# Patient Record
Sex: Female | Born: 1990 | Race: Black or African American | Hispanic: No | Marital: Single | Smoking: Current every day smoker
Health system: Southern US, Community
[De-identification: ages and names within clinical notes are randomized; demographics above are authoritative.]

## PROBLEM LIST (undated history)

## (undated) DIAGNOSIS — F411 Generalized anxiety disorder: Secondary | ICD-10-CM

## (undated) DIAGNOSIS — R51 Headache: Secondary | ICD-10-CM

## (undated) DIAGNOSIS — F319 Bipolar disorder, unspecified: Secondary | ICD-10-CM

## (undated) DIAGNOSIS — A5901 Trichomonal vulvovaginitis: Secondary | ICD-10-CM

## (undated) DIAGNOSIS — R519 Headache, unspecified: Secondary | ICD-10-CM

## (undated) DIAGNOSIS — N750 Cyst of Bartholin's gland: Secondary | ICD-10-CM

## (undated) DIAGNOSIS — Z8619 Personal history of other infectious and parasitic diseases: Secondary | ICD-10-CM

## (undated) DIAGNOSIS — D649 Anemia, unspecified: Secondary | ICD-10-CM

## (undated) DIAGNOSIS — K219 Gastro-esophageal reflux disease without esophagitis: Secondary | ICD-10-CM

## (undated) HISTORY — DX: Trichomonal vulvovaginitis: A59.01

## (undated) HISTORY — PX: WISDOM TOOTH EXTRACTION: SHX21

---
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
## 2013-03-01 ENCOUNTER — Emergency Department (HOSPITAL_COMMUNITY): Payer: Managed Care, Other (non HMO)

## 2013-03-01 ENCOUNTER — Encounter (HOSPITAL_COMMUNITY): Payer: Self-pay | Admitting: Emergency Medicine

## 2013-03-01 ENCOUNTER — Emergency Department (HOSPITAL_COMMUNITY)
Admission: EM | Admit: 2013-03-01 | Discharge: 2013-03-01 | Disposition: A | Discharge status: Home / Self Care | Payer: Managed Care, Other (non HMO) | Attending: Emergency Medicine | Admitting: Emergency Medicine

## 2013-03-01 DIAGNOSIS — S7002XA Contusion of left hip, initial encounter: Secondary | ICD-10-CM

## 2013-03-01 DIAGNOSIS — Z3202 Encounter for pregnancy test, result negative: Secondary | ICD-10-CM | POA: Insufficient documentation

## 2013-03-01 DIAGNOSIS — IMO0002 Reserved for concepts with insufficient information to code with codable children: Secondary | ICD-10-CM | POA: Insufficient documentation

## 2013-03-01 DIAGNOSIS — Z79899 Other long term (current) drug therapy: Secondary | ICD-10-CM | POA: Insufficient documentation

## 2013-03-01 DIAGNOSIS — S7000XA Contusion of unspecified hip, initial encounter: Secondary | ICD-10-CM | POA: Insufficient documentation

## 2013-03-01 DIAGNOSIS — Y9301 Activity, walking, marching and hiking: Secondary | ICD-10-CM | POA: Insufficient documentation

## 2013-03-01 DIAGNOSIS — F17200 Nicotine dependence, unspecified, uncomplicated: Secondary | ICD-10-CM | POA: Insufficient documentation

## 2013-03-01 DIAGNOSIS — F319 Bipolar disorder, unspecified: Secondary | ICD-10-CM | POA: Insufficient documentation

## 2013-03-01 DIAGNOSIS — Y92410 Unspecified street and highway as the place of occurrence of the external cause: Secondary | ICD-10-CM | POA: Insufficient documentation

## 2013-03-01 HISTORY — DX: Bipolar disorder, unspecified: F31.9

## 2013-03-01 MED ORDER — MORPHINE SULFATE 4 MG/ML IJ SOLN
4.0000 mg | Freq: Once | INTRAMUSCULAR | Status: AC
  Administered 2013-03-01: 4 mg via INTRAMUSCULAR
  Filled 2013-03-01: qty 1

## 2013-03-01 MED ORDER — IBUPROFEN 800 MG PO TABS: 800.0000 mg | ORAL_TABLET | Freq: Three times a day (TID) | ORAL | Status: DC | PRN
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
## 2013-03-01 NOTE — ED Notes (Signed)
Patient transported to X-ray 
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
## 2013-07-12 ENCOUNTER — Other Ambulatory Visit: Payer: Self-pay | Admitting: Obstetrics & Gynecology

## 2013-07-17 ENCOUNTER — Encounter (HOSPITAL_COMMUNITY): Payer: Self-pay | Admitting: Pharmacist

## 2013-07-17 ENCOUNTER — Encounter (HOSPITAL_COMMUNITY): Payer: Managed Care, Other (non HMO) | Admitting: Anesthesiology

## 2013-07-17 ENCOUNTER — Encounter (HOSPITAL_COMMUNITY)
Admission: RE | Disposition: A | Discharge status: Home / Self Care | Payer: Self-pay | Source: Ambulatory Visit | Attending: Obstetrics & Gynecology

## 2013-07-17 ENCOUNTER — Ambulatory Visit (HOSPITAL_COMMUNITY)
Admission: RE | Admit: 2013-07-17 | Discharge: 2013-07-17 | Disposition: A | Discharge status: Home / Self Care | Payer: Managed Care, Other (non HMO) | Source: Ambulatory Visit | Attending: Obstetrics & Gynecology | Admitting: Obstetrics & Gynecology

## 2013-07-17 ENCOUNTER — Ambulatory Visit (HOSPITAL_COMMUNITY): Payer: Managed Care, Other (non HMO) | Admitting: Anesthesiology

## 2013-07-17 DIAGNOSIS — N751 Abscess of Bartholin's gland: Secondary | ICD-10-CM | POA: Insufficient documentation

## 2013-07-17 DIAGNOSIS — F17200 Nicotine dependence, unspecified, uncomplicated: Secondary | ICD-10-CM | POA: Insufficient documentation

## 2013-07-17 DIAGNOSIS — F319 Bipolar disorder, unspecified: Secondary | ICD-10-CM | POA: Insufficient documentation

## 2013-07-17 HISTORY — PX: BARTHOLIN CYST MARSUPIALIZATION: SHX5383

## 2013-07-17 HISTORY — PX: DILATION AND CURETTAGE OF UTERUS: SHX78

## 2013-07-17 SURGERY — MARSUPIALIZATION, CYST, BARTHOLIN'S GLAND
Anesthesia: General | Site: Vagina

## 2013-07-17 MED ORDER — MIDAZOLAM HCL 2 MG/2ML IJ SOLN
INTRAMUSCULAR | Status: AC
  Filled 2013-07-17: qty 2

## 2013-07-17 MED ORDER — KETOROLAC TROMETHAMINE 30 MG/ML IJ SOLN
INTRAMUSCULAR | Status: DC | PRN
  Administered 2013-07-17: 30 mg via INTRAVENOUS

## 2013-07-17 MED ORDER — LIDOCAINE HCL (CARDIAC) 20 MG/ML IV SOLN
INTRAVENOUS | Status: AC
  Filled 2013-07-17: qty 5

## 2013-07-17 MED ORDER — PROPOFOL 10 MG/ML IV EMUL
INTRAVENOUS | Status: AC
  Filled 2013-07-17: qty 20

## 2013-07-17 MED ORDER — ONDANSETRON HCL 4 MG/2ML IJ SOLN
INTRAMUSCULAR | Status: DC | PRN
  Administered 2013-07-17: 4 mg via INTRAVENOUS

## 2013-07-17 MED ORDER — BUPIVACAINE HCL (PF) 0.5 % IJ SOLN
INTRAMUSCULAR | Status: AC
  Filled 2013-07-17: qty 30

## 2013-07-17 MED ORDER — DEXAMETHASONE SODIUM PHOSPHATE 10 MG/ML IJ SOLN
INTRAMUSCULAR | Status: AC
  Filled 2013-07-17: qty 1

## 2013-07-17 MED ORDER — BACITRACIN ZINC 500 UNIT/GM EX OINT
TOPICAL_OINTMENT | CUTANEOUS | Status: AC
  Filled 2013-07-17: qty 15

## 2013-07-17 MED ORDER — FENTANYL CITRATE 0.05 MG/ML IJ SOLN
INTRAMUSCULAR | Status: AC
  Filled 2013-07-17: qty 5

## 2013-07-17 MED ORDER — ONDANSETRON HCL 4 MG/2ML IJ SOLN
INTRAMUSCULAR | Status: AC
  Filled 2013-07-17: qty 2

## 2013-07-17 MED ORDER — MIDAZOLAM HCL 2 MG/2ML IJ SOLN
INTRAMUSCULAR | Status: DC | PRN
  Administered 2013-07-17: 2 mg via INTRAVENOUS

## 2013-07-17 MED ORDER — KETOROLAC TROMETHAMINE 30 MG/ML IJ SOLN
INTRAMUSCULAR | Status: AC
  Filled 2013-07-17: qty 1

## 2013-07-17 MED ORDER — FENTANYL CITRATE 0.05 MG/ML IJ SOLN: 25.0000 ug | INTRAMUSCULAR | Status: DC | PRN

## 2013-07-17 MED ORDER — FENTANYL CITRATE 0.05 MG/ML IJ SOLN
INTRAMUSCULAR | Status: DC | PRN
  Administered 2013-07-17: 100 ug via INTRAVENOUS

## 2013-07-17 MED ORDER — BACITRACIN ZINC 500 UNIT/GM EX OINT
TOPICAL_OINTMENT | CUTANEOUS | Status: DC | PRN
  Administered 2013-07-17: 1 via TOPICAL

## 2013-07-17 MED ORDER — BUPIVACAINE HCL 0.5 % IJ SOLN
INTRAMUSCULAR | Status: DC | PRN
  Administered 2013-07-17: 18 mL

## 2013-07-17 MED ORDER — LIDOCAINE HCL (CARDIAC) 20 MG/ML IV SOLN
INTRAVENOUS | Status: DC | PRN
  Administered 2013-07-17: 50 mg via INTRAVENOUS

## 2013-07-17 MED ORDER — LACTATED RINGERS IV SOLN
INTRAVENOUS | Status: DC
Start: 2013-07-17 — End: 2013-07-17
  Administered 2013-07-17 (×2): via INTRAVENOUS

## 2013-07-17 MED ORDER — BUPIVACAINE-EPINEPHRINE (PF) 0.5% -1:200000 IJ SOLN
INTRAMUSCULAR | Status: AC
  Filled 2013-07-17: qty 30

## 2013-07-17 MED ORDER — DEXAMETHASONE SODIUM PHOSPHATE 10 MG/ML IJ SOLN
INTRAMUSCULAR | Status: DC | PRN
  Administered 2013-07-17: 10 mg via INTRAVENOUS

## 2013-07-17 MED ORDER — PROPOFOL 10 MG/ML IV BOLUS
INTRAVENOUS | Status: DC | PRN
  Administered 2013-07-17: 20 mg via INTRAVENOUS
  Administered 2013-07-17: 180 mg via INTRAVENOUS

## 2013-07-17 SURGICAL SUPPLY — 25 items
CATH ROBINSON RED A/P 16FR (CATHETERS) ×4 IMPLANT
CLOTH BEACON ORANGE TIMEOUT ST (SAFETY) ×4 IMPLANT
CONTAINER PREFILL 10% NBF 60ML (FORM) IMPLANT
DRSG TELFA 3X8 NADH (GAUZE/BANDAGES/DRESSINGS) ×4 IMPLANT
ELECT NEEDLE TIP 2.8 STRL (NEEDLE) ×4 IMPLANT
GAUZE SPONGE 4X4 16PLY XRAY LF (GAUZE/BANDAGES/DRESSINGS) ×8 IMPLANT
GLOVE BIO SURGEON STRL SZ 6.5 (GLOVE) ×3 IMPLANT
GLOVE BIO SURGEONS STRL SZ 6.5 (GLOVE) ×1
GLOVE BIOGEL PI IND STRL 7.0 (GLOVE) ×2 IMPLANT
GLOVE BIOGEL PI INDICATOR 7.0 (GLOVE) ×2
GLOVE ECLIPSE 6.0 STRL STRAW (GLOVE) ×4 IMPLANT
GOWN STRL REUS W/TWL LRG LVL3 (GOWN DISPOSABLE) ×16 IMPLANT
NEEDLE SPNL 22GX3.5 QUINCKE BK (NEEDLE) ×4 IMPLANT
PACK VAGINAL MINOR WOMEN LF (CUSTOM PROCEDURE TRAY) ×4 IMPLANT
PAD OB MATERNITY 4.3X12.25 (PERSONAL CARE ITEMS) ×4 IMPLANT
SPONGE SURGIFOAM ABS GEL 12-7 (HEMOSTASIS) ×4 IMPLANT
SUT MON AB 3-0 SH 27 (SUTURE) ×4
SUT MON AB 3-0 SH27 (SUTURE) ×4 IMPLANT
SUT VIC AB 3-0 SH 27 (SUTURE) ×2
SUT VIC AB 3-0 SH 27XBRD (SUTURE) ×2 IMPLANT
SWAB CULTURE LIQ STUART DBL (MISCELLANEOUS) ×4 IMPLANT
SYR CONTROL 10ML LL (SYRINGE) ×4 IMPLANT
TOWEL OR 17X24 6PK STRL BLUE (TOWEL DISPOSABLE) ×8 IMPLANT
WATER STERILE IRR 1000ML POUR (IV SOLUTION) ×4 IMPLANT
YANKAUER SUCT BULB TIP NO VENT (SUCTIONS) ×4 IMPLANT

## 2013-07-17 NOTE — Discharge Instructions (Signed)

## 2013-07-17 NOTE — Anesthesia Postprocedure Evaluation (Signed)
  Anesthesia Post-op Note  Patient: Suzanne Holt  Procedure(s) Performed: Procedure(s): BARTHOLIN CYST MARSUPIALIZATION (N/A) Bartholin Cyst culture  Patient Location: PACU  Anesthesia Type:General  Level of Consciousness: awake, alert  and oriented  Airway and Oxygen Therapy: Patient Spontanous Breathing  Post-op Pain: none  Post-op Assessment: Post-op Vital signs reviewed, Patient's Cardiovascular Status Stable, Respiratory Function Stable, Patent Airway, No signs of Nausea or vomiting and Pain level controlled  Post-op Vital Signs: Reviewed and stable  Last Vitals:  Filed Vitals:   07/17/13 1345  BP: 114/65  Pulse: 69  Temp:   Resp: 15    Complications: No apparent anesthesia complications

## 2013-07-17 NOTE — H&P (Signed)
  Suzanne Holt is an 23 y.o. female. Presents for Bartholin Cyst Marsupialization.  I&D attempted in office, unsuccessful. 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  Pertinent Gynecological History:  Menses: Normal  Bleeding: normal  Contraception: condoms  DES exposure: denies  Blood transfusions: none  Sexually transmitted diseases: no past history  Previous GYN Procedures: none  Last mammogram: n/a   Last pap: n/a  OB History: G0,  Menstrual History:  Menarche age: [age]  Patient's last menstrual period was 06/23/2013.   Past Medical History   Diagnosis  Date   .  Bipolar 1 disorder     No past surgical history on file.  No family history on file.  Social History: reports that she has been smoking. She does not have any smokeless tobacco history on file. She reports that she drinks alcohol. She reports that she does not use illicit drugs.  Allergies: No Known Allergies  Prescriptions prior to admission   Medication  Sig  Dispense  Refill   .  QUEtiapine (SEROQUEL) 400 MG tablet  Take 200 mg by mouth at bedtime.     .  traMADol (ULTRAM) 50 MG tablet  Take 50 mg by mouth every 6 (six) hours as needed for moderate pain.     Marland Kitchen.  lidocaine (XYLOCAINE) 2 % jelly  Apply 1 application topically 2 (two) times daily as needed.     .  metroNIDAZOLE (FLAGYL) 500 MG tablet  Take 500 mg by mouth 2 (two) times daily.     Marland Kitchen.  oxyCODONE-acetaminophen (PERCOCET/ROXICET) 5-325 MG per tablet  Take 1 tablet by mouth every 4 (four) hours as needed for severe pain.      Review of Systems  Constitutional: Negative for fever.  Eyes: Negative for blurred vision.  Cardiovascular: Negative for chest pain.  Gastrointestinal: Negative for heartburn.  Genitourinary: Negative for dysuria.  Skin: Negative for rash.  Neurological: Negative for dizziness.  Endo/Heme/Allergies: Does not bruise/bleed easily.  Blood pressure 121/67, pulse 91, temperature 98.6 F (37 C), temperature  source Oral, resp. rate 20, height 5\' 4"  (1.626 m), weight 65.772 kg (145 lb), last menstrual period 06/23/2013, SpO2 99.00%.  Physical Exam  Constitutional: She is oriented to person, place, and time. She appears well-developed and well-nourished.  Cardiovascular: Normal rate and regular rhythm.  Genitourinary:  Right Bartholin Cyst  Neurological: She is alert and oriented to person, place, and time.  No results found for this or any previous visit (from the past 24 hour(s)).  No results found.   Assessment/Plan:  23 yo healthy female with recurrent Right Bartholin Cyst  To OR for Bartholin Cyst Marsupialization  Suzanne Holt  07/17/2013, 12:00 PM

## 2013-07-17 NOTE — Transfer of Care (Signed)
Immediate Anesthesia Transfer of Care Note  Patient: Suzanne Holt  Procedure(s) Performed: Procedure(s): BARTHOLIN CYST MARSUPIALIZATION (N/A) Bartholin Cyst culture  Patient Location: PACU  Anesthesia Type:General  Level of Consciousness: awake  Airway & Oxygen Therapy: Patient Spontanous Breathing  Post-op Assessment: Report given to PACU RN  Post vital signs: stable  Filed Vitals:   07/17/13 1125  BP: 121/67  Pulse: 91  Temp: 37 C  Resp: 20    Complications: No apparent anesthesia complications

## 2013-07-17 NOTE — Anesthesia Preprocedure Evaluation (Signed)
Anesthesia Evaluation  Patient identified by MRN, date of birth, ID band Patient awake    Reviewed: Allergy & Precautions, H&P , Patient's Chart, lab work & pertinent test results, reviewed documented beta blocker date and time   Airway Mallampati: II  TM Distance: >3 FB Neck ROM: full    Dental no notable dental hx.    Pulmonary Current Smoker,  breath sounds clear to auscultation  Pulmonary exam normal       Cardiovascular Rhythm:regular Rate:Normal     Neuro/Psych    GI/Hepatic   Endo/Other    Renal/GU      Musculoskeletal   Abdominal   Peds  Hematology   Anesthesia Other Findings   Reproductive/Obstetrics                            Anesthesia Physical Anesthesia Plan  ASA: II  Anesthesia Plan:    Post-op Pain Management:    Induction: Intravenous  Airway Management Planned: LMA  Additional Equipment:   Intra-op Plan:   Post-operative Plan:   Informed Consent: I have reviewed the patients History and Physical, chart, labs and discussed the procedure including the risks, benefits and alternatives for the proposed anesthesia with the patient or authorized representative who has indicated his/her understanding and acceptance.   Dental Advisory Given and Dental advisory given  Plan Discussed with: CRNA and Surgeon  Anesthesia Plan Comments: (Discussed GA with LMA, possible sore throat, potential need to switch to ETT, N/V, pulmonary aspiration. Questions answered. )        Anesthesia Quick Evaluation  

## 2013-07-18 ENCOUNTER — Encounter (HOSPITAL_COMMUNITY): Payer: Self-pay | Admitting: Obstetrics & Gynecology

## 2013-07-18 ENCOUNTER — Telehealth (HOSPITAL_BASED_OUTPATIENT_CLINIC_OR_DEPARTMENT_OTHER): Payer: Self-pay

## 2013-07-18 NOTE — Op Note (Signed)
Pre-operative Diagnosis: Right Bartholin Cyst  Post-operative Diagnosis: Right Bartholin Abscess  Surgeon: Surgeon(s) and Role:    * Essie HartWalda Namish Krise, MD - Primary    * Mickel Baasichard D Kaplan, MD - Assisting   Procedure and Anesthesia:  Procedure(s) and Anesthesia Type:    * BARTHOLIN CYST MARSUPIALIZATION - General    * Bartholin Cyst culture  ASA Class: 3   ANESTHESIA: IV sedation/LMA and local 0.25% Marcaine   INTRAVENOUS FLUIDS: 1000mL   ESTIMATED BLOOD LOSS: 15mL   URINE OUTPUT: 50mL  FINDINGS: Right 3cm Bartholin Cyst, copious amount of purulent material evacuated. Otherwise normal external genitalia  SPECIMEN: Culture from Bartholin Abscess fluid  COMPLICATIONS:  none   INDICATIONS FOR THE PROCEDURE Ms. Noe Genseters is a 23 year  old  G0  with  a  history  of  Right bartholin's gland cyst previously treated with placement of a Word catheter with subsequent recurrence of the cyst x 3  SURGICAL RISKS: The patient was informed of the risks and benefits of marsupialization of a Bartholin cyst. Risks included but were not limited to bleeding, infection, injury to the vulva, vagina, or surrounding tissues and/or recurrence of the cyst. The patient expressed understanding of the risks involved, all questions were answered, and the patient consented to the procedure.  DESCRIPTION OF THE PROCEDURE The patient was taken to the operating room where a time out was performed to confirm correct patient and correct procedure. IV sedation was established and found to be adequate. The patient was then positioned on the operating table in the dorsal lithotomy position with the legs supported using stirrups. All pressure points were padded and a Bair hugger/warm blankets was/were placed to maintain control of core body temperature. The patient was then prepped and draped in the usual sterile fashion. The labia were palpated bilaterally Right 3cm Bartholin Cyst palpated. A 3 cm vertical incision was made at the  junction between the skin and the mucosa and the cyst was partially dissected off the mucosa using a 15 blade scalpel.  On opening, the cyst was noted to contain mucinous,purulent malodorous material. The cyst wall was then sutured to the surrounding mucosa using 2-0 synthetic absorbable suture (Vicryl) Patient tolerated procedure well and was transferred to the PACU in good condition.  All instrument, needle, raytec counts were correct x 3.    Essie HartWalda Makennah Omura

## 2013-07-18 NOTE — Telephone Encounter (Signed)
[facility].  [facility] Dr Essie HartWalda Pinn [facility] .

## 2013-07-19 LAB — BODY FLUID CULTURE

## 2014-03-25 ENCOUNTER — Encounter (HOSPITAL_COMMUNITY): Payer: Self-pay | Admitting: Emergency Medicine

## 2014-03-25 ENCOUNTER — Emergency Department (HOSPITAL_COMMUNITY): Payer: Worker's Compensation

## 2014-03-25 ENCOUNTER — Emergency Department (HOSPITAL_COMMUNITY)
Admission: EM | Admit: 2014-03-25 | Discharge: 2014-03-25 | Disposition: A | Discharge status: Home / Self Care | Payer: Worker's Compensation | Attending: Emergency Medicine | Admitting: Emergency Medicine

## 2014-03-25 DIAGNOSIS — Y9389 Activity, other specified: Secondary | ICD-10-CM | POA: Diagnosis not present

## 2014-03-25 DIAGNOSIS — Z792 Long term (current) use of antibiotics: Secondary | ICD-10-CM | POA: Diagnosis not present

## 2014-03-25 DIAGNOSIS — W260XXA Contact with knife, initial encounter: Secondary | ICD-10-CM | POA: Diagnosis not present

## 2014-03-25 DIAGNOSIS — Y92009 Unspecified place in unspecified non-institutional (private) residence as the place of occurrence of the external cause: Secondary | ICD-10-CM | POA: Diagnosis not present

## 2014-03-25 DIAGNOSIS — R531 Weakness: Secondary | ICD-10-CM | POA: Insufficient documentation

## 2014-03-25 DIAGNOSIS — Z23 Encounter for immunization: Secondary | ICD-10-CM | POA: Insufficient documentation

## 2014-03-25 DIAGNOSIS — Y990 Civilian activity done for income or pay: Secondary | ICD-10-CM | POA: Diagnosis not present

## 2014-03-25 DIAGNOSIS — Z720 Tobacco use: Secondary | ICD-10-CM | POA: Diagnosis not present

## 2014-03-25 DIAGNOSIS — Z8659 Personal history of other mental and behavioral disorders: Secondary | ICD-10-CM | POA: Diagnosis not present

## 2014-03-25 DIAGNOSIS — S61217A Laceration without foreign body of left little finger without damage to nail, initial encounter: Secondary | ICD-10-CM | POA: Insufficient documentation

## 2014-03-25 DIAGNOSIS — S61219A Laceration without foreign body of unspecified finger without damage to nail, initial encounter: Secondary | ICD-10-CM

## 2014-03-25 MED ORDER — LIDOCAINE HCL 2 % IJ SOLN
5.0000 mL | Freq: Once | INTRAMUSCULAR | Status: AC
  Administered 2014-03-25: 100 mg via INTRADERMAL
  Filled 2014-03-25: qty 20

## 2014-03-25 MED ORDER — IBUPROFEN 600 MG PO TABS: 600.0000 mg | ORAL_TABLET | Freq: Four times a day (QID) | ORAL | Status: DC | PRN

## 2014-03-25 MED ORDER — CEPHALEXIN 250 MG PO CAPS: 250.0000 mg | ORAL_CAPSULE | Freq: Four times a day (QID) | ORAL | Status: DC

## 2014-03-25 MED ORDER — TETANUS-DIPHTH-ACELL PERTUSSIS 5-2.5-18.5 LF-MCG/0.5 IM SUSP
0.5000 mL | Freq: Once | INTRAMUSCULAR | Status: AC
  Administered 2014-03-25: 0.5 mL via INTRAMUSCULAR
  Filled 2014-03-25: qty 0.5

## 2014-03-25 NOTE — ED Notes (Signed)
Pt presents with laceration to L little finger. Pt states she was at work at [employer] tonight, attempted to cut brownies with steak knife and cut finger. Clean dry bandage in place

## 2014-03-25 NOTE — ED Notes (Signed)
Suture cart at bedside 

## 2014-03-25 NOTE — Discharge Instructions (Signed)
Ibuprofen for pain. Keflex to prevent infection. Follow up with Dr. Amanda PeaGramig in the office as referred. Your apt is on Thursday at 7:30 am.   Laceration Care, Adult A laceration is a cut or lesion that goes through all layers of the skin and into the tissue just beneath the skin. TREATMENT  Some lacerations may not require closure. Some lacerations may not be able to be closed due to an increased risk of infection. It is important to see your caregiver as soon as possible after an injury to minimize the risk of infection and maximize the opportunity for successful closure. If closure is appropriate, pain medicines may be given, if needed. The wound will be cleaned to help prevent infection. Your caregiver will use stitches (sutures), staples, wound glue (adhesive), or skin adhesive strips to repair the laceration. These tools bring the skin edges together to allow for faster healing and a better cosmetic outcome. However, all wounds will heal with a scar. Once the wound has healed, scarring can be minimized by covering the wound with sunscreen during the day for 1 full year. HOME CARE INSTRUCTIONS  For sutures or staples:  Keep the wound clean and dry.  If you were given a bandage (dressing), you should change it at least once a day. Also, change the dressing if it becomes wet or dirty, or as directed by your caregiver.  Wash the wound with soap and water 2 times a day. Rinse the wound off with water to remove all soap. Pat the wound dry with a clean towel.  After cleaning, apply a thin layer of the antibiotic ointment as recommended by your caregiver. This will help prevent infection and keep the dressing from sticking.  You may shower as usual after the first 24 hours. Do not soak the wound in water until the sutures are removed.  Only take over-the-counter or prescription medicines for pain, discomfort, or fever as directed by your caregiver.  Get your sutures or staples removed as directed by  your caregiver. For skin adhesive strips:  Keep the wound clean and dry.  Do not get the skin adhesive strips wet. You may bathe carefully, using caution to keep the wound dry.  If the wound gets wet, pat it dry with a clean towel.  Skin adhesive strips will fall off on their own. You may trim the strips as the wound heals. Do not remove skin adhesive strips that are still stuck to the wound. They will fall off in time. For wound adhesive:  You may briefly wet your wound in the shower or bath. Do not soak or scrub the wound. Do not swim. Avoid periods of heavy perspiration until the skin adhesive has fallen off on its own. After showering or bathing, gently pat the wound dry with a clean towel.  Do not apply liquid medicine, cream medicine, or ointment medicine to your wound while the skin adhesive is in place. This may loosen the film before your wound is healed.  If a dressing is placed over the wound, be careful not to apply tape directly over the skin adhesive. This may cause the adhesive to be pulled off before the wound is healed.  Avoid prolonged exposure to sunlight or tanning lamps while the skin adhesive is in place. Exposure to ultraviolet light in the first year will darken the scar.  The skin adhesive will usually remain in place for 5 to 10 days, then naturally fall off the skin. Do not pick at the  adhesive film. You may need a tetanus shot if:  You cannot remember when you had your last tetanus shot.  You have never had a tetanus shot. If you get a tetanus shot, your arm may swell, get red, and feel warm to the touch. This is common and not a problem. If you need a tetanus shot and you choose not to have one, there is a rare chance of getting tetanus. Sickness from tetanus can be serious. SEEK MEDICAL CARE IF:   You have redness, swelling, or increasing pain in the wound.  You see a red line that goes away from the wound.  You have yellowish-white fluid (pus) coming  from the wound.  You have a fever.  You notice a bad smell coming from the wound or dressing.  Your wound breaks open before or after sutures have been removed.  You notice something coming out of the wound such as wood or glass.  Your wound is on your hand or foot and you cannot move a finger or toe. SEEK IMMEDIATE MEDICAL CARE IF:   Your pain is not controlled with prescribed medicine.  You have severe swelling around the wound causing pain and numbness or a change in color in your arm, hand, leg, or foot.  Your wound splits open and starts bleeding.  You have worsening numbness, weakness, or loss of function of any joint around or beyond the wound.  You develop painful lumps near the wound or on the skin anywhere on your body. MAKE SURE YOU:   Understand these instructions.  Will watch your condition.  Will get help right away if you are not doing well or get worse. Document Released: 02/28/2005 Document Revised: 05/23/2011 Document Reviewed: 08/24/2010 [facility] Patient Information 2015 [place], [place]. This information is not intended to replace advice given to you by your health care provider. Make sure you discuss any questions you have with your health care provider.

## 2014-03-25 NOTE — ED Provider Notes (Signed)
CSN: [id number]     Arrival date & time 03/25/14  1942 History   First MD Initiated Contact with Patient 03/25/14 2201     Chief Complaint  Patient presents with  . Laceration     (Consider location/radiation/quality/duration/timing/severity/associated sxs/prior Treatment) HPI Suzanne Holt is a 24 y.o. female with no medical problems, presents to ED with complaint of laceration to the left little finger. States was using steak knife when accidentally cutting herself. States unable to bend finger. No other injuries. No numbness distally. Tetanus unknown. Pt applied pressure to stop bleeding. Movement of finger and palpation making symptoms worse, nothing making them beter  Past Medical History  Diagnosis Date  . Bipolar 1 disorder    Past Surgical History  Procedure Laterality Date  . Bartholin cyst marsupialization N/A 07/17/2013    Procedure: BARTHOLIN CYST MARSUPIALIZATION;  Surgeon: Essie Hart, MD;  Location: [facility];  Service: Gynecology;  Laterality: N/A;  . Dilation and curettage of uterus  07/17/2013    Procedure: Bartholin Cyst culture;  Surgeon: Essie Hart, MD;  Location: [facility];  Service: Gynecology;;   No family history on file. History  Substance Use Topics  . Smoking status: Current Some Day Smoker  . Smokeless tobacco: Not on file  . Alcohol Use: Yes   OB History    No data available     Review of Systems  Constitutional: Negative for fever and chills.  Skin: Positive for wound.  Neurological: Positive for weakness. Negative for numbness.      Allergies  Review of patient's allergies indicates no known allergies.  Home Medications   Prior to Admission medications   Medication Sig Start Date End Date Taking? Authorizing Provider  ibuprofen (ADVIL,MOTRIN) 200 MG tablet Take 400 mg by mouth every 6 (six) hours as needed for headache or moderate pain.   Yes Historical Provider, MD  metroNIDAZOLE (FLAGYL) 500 MG tablet Take 500 mg by mouth 2 (two) times  daily.    Historical Provider, MD  oxyCODONE-acetaminophen (PERCOCET/ROXICET) 5-325 MG per tablet Take 1 tablet by mouth every 4 (four) hours as needed for severe pain.    Historical Provider, MD  traMADol (ULTRAM) 50 MG tablet Take 50 mg by mouth every 6 (six) hours as needed for moderate pain.    Historical Provider, MD   BP 125/72 mmHg  Pulse 106  Temp(Src) 97.9 F (36.6 C) (Oral)  Resp 16  SpO2 100%  LMP 02/22/2014 Physical Exam  Constitutional: She appears well-developed and well-nourished. No distress.  Eyes: Conjunctivae are normal.  Neck: Neck supple.  Musculoskeletal:  Pt with laceration to the pip joint over palmar surface of left 5th finger. Unable to flex at PIP or DIP joints. Able to flex at MCP joint. Normal extension at all joints. Pt holding finger extended at rest. Sensation intact distally. Cap refill <2sec distally  Neurological: She is alert.  Skin: Skin is warm and dry.  1cm laceration to the palmar surface of left fifth finger at pip joint.   Nursing note and vitals reviewed.   ED Course  Procedures (including critical care time) Labs Review Labs Reviewed - No data to display  Imaging Review Dg Finger Little Left  03/25/2014   CLINICAL DATA:  Little finger laceration  EXAM: LEFT LITTLE FINGER 2+V  COMPARISON:  None.  FINDINGS: Three views of the left fifth finger submitted. No acute fracture or subluxation. No radiopaque foreign body.  IMPRESSION: Negative.   Electronically Signed   By: Lanette Hampshire.D.  On: 03/25/2014 22:38     EKG Interpretation None      LACERATION REPAIR Performed by: Jaynie CrumbleKIRICHENKO, Lenny Fiumara A Authorized by: Jaynie CrumbleKIRICHENKO, Joseph Bias A Consent: Verbal consent obtained. Risks and benefits: risks, benefits and alternatives were discussed Consent given by: patient Patient identity confirmed: provided demographic data Prepped and Draped in normal sterile fashion Wound explored  Laceration Location: left fifth finger  Laceration Length:  1cm  No Foreign Bodies seen or palpated  Anesthesia: local infiltration  Local anesthetic: lidocaine 2% wo epinephrine  Anesthetic total: 2 ml  Irrigation method: syringe Amount of cleaning: standard  Skin closure: prolene 4.0  Number of sutures: 2  Technique: simple interrupted  Patient tolerance: Patient tolerated the procedure well with no immediate complications.  MDM   Final diagnoses:  Finger laceration    patient is a laceration to the left fifth finger, she is left-handed. She is unable to flex left finger at PIP and DIP joints. I suspect tendon injury. X-rays negative. Discussed with Dr. Amanda PeaGramig, advised to close, antibiotics, finger splint, follow-up with him in the office Thursday morning at 7:30 AM. Discussed plan with patient, she was understanding. At this time finger is neurovascularly intact. Patient stable for discharge.  [name], PA-C 03/26/14 0026  Candyce ChurnJohn David Wofford III, MD 03/28/14 0000

## 2014-03-28 ENCOUNTER — Encounter (HOSPITAL_COMMUNITY): Payer: Self-pay | Admitting: *Deleted

## 2014-03-28 NOTE — Progress Notes (Signed)
Pt denies SOB, chest pain, and being under the care of a cardiologist. Pt denies having an EKG, chest x ray, echo, stress test and cardiac cath.

## 2014-03-29 ENCOUNTER — Ambulatory Visit (HOSPITAL_COMMUNITY): Payer: Worker's Compensation | Admitting: Anesthesiology

## 2014-03-29 ENCOUNTER — Encounter (HOSPITAL_COMMUNITY)
Admission: RE | Disposition: A | Discharge status: Home / Self Care | Payer: Self-pay | Source: Ambulatory Visit | Attending: Orthopedic Surgery

## 2014-03-29 ENCOUNTER — Ambulatory Visit (HOSPITAL_COMMUNITY)
Admission: RE | Admit: 2014-03-29 | Discharge: 2014-03-29 | Disposition: A | Discharge status: Home / Self Care | Payer: Worker's Compensation | Source: Ambulatory Visit | Attending: Orthopedic Surgery | Admitting: Orthopedic Surgery

## 2014-03-29 DIAGNOSIS — X58XXXA Exposure to other specified factors, initial encounter: Secondary | ICD-10-CM | POA: Insufficient documentation

## 2014-03-29 DIAGNOSIS — D649 Anemia, unspecified: Secondary | ICD-10-CM | POA: Diagnosis not present

## 2014-03-29 DIAGNOSIS — S66127A Laceration of flexor muscle, fascia and tendon of left little finger at wrist and hand level, initial encounter: Secondary | ICD-10-CM | POA: Diagnosis present

## 2014-03-29 DIAGNOSIS — Y929 Unspecified place or not applicable: Secondary | ICD-10-CM | POA: Diagnosis not present

## 2014-03-29 DIAGNOSIS — F17210 Nicotine dependence, cigarettes, uncomplicated: Secondary | ICD-10-CM | POA: Diagnosis not present

## 2014-03-29 DIAGNOSIS — F319 Bipolar disorder, unspecified: Secondary | ICD-10-CM | POA: Insufficient documentation

## 2014-03-29 HISTORY — DX: Headache, unspecified: R51.9

## 2014-03-29 HISTORY — PX: FLEXOR TENDON REPAIR: SHX6501

## 2014-03-29 HISTORY — DX: Anemia, unspecified: D64.9

## 2014-03-29 HISTORY — DX: Headache: R51

## 2014-03-29 LAB — CBC
HCT: 38.3 % (ref 36.0–46.0)
HEMOGLOBIN: 12.9 g/dL (ref 12.0–15.0)
MCH: 28.9 pg (ref 26.0–34.0)
MCHC: 33.7 g/dL (ref 30.0–36.0)
MCV: 85.7 fL (ref 78.0–100.0)
PLATELETS: 217 10*3/uL (ref 150–400)
RBC: 4.47 MIL/uL (ref 3.87–5.11)
RDW: 12.6 % (ref 11.5–15.5)
WBC: 7.7 10*3/uL (ref 4.0–10.5)

## 2014-03-29 LAB — HCG, SERUM, QUALITATIVE: Preg, Serum: NEGATIVE

## 2014-03-29 SURGERY — REPAIR, TENDON, FLEXOR
Anesthesia: General | Site: Finger | Laterality: Left

## 2014-03-29 MED ORDER — PHENYLEPHRINE HCL 10 MG/ML IJ SOLN
INTRAMUSCULAR | Status: DC | PRN
Start: 2014-03-29 — End: 2014-03-29
  Administered 2014-03-29 (×2): 40 ug via INTRAVENOUS
  Administered 2014-03-29 (×2): 80 ug via INTRAVENOUS
  Administered 2014-03-29: 40 ug via INTRAVENOUS

## 2014-03-29 MED ORDER — FENTANYL CITRATE 0.05 MG/ML IJ SOLN
INTRAMUSCULAR | Status: AC
  Filled 2014-03-29: qty 5

## 2014-03-29 MED ORDER — MIDAZOLAM HCL 2 MG/2ML IJ SOLN
INTRAMUSCULAR | Status: AC
  Filled 2014-03-29: qty 2

## 2014-03-29 MED ORDER — BUPIVACAINE HCL (PF) 0.25 % IJ SOLN
INTRAMUSCULAR | Status: DC | PRN
  Administered 2014-03-29: 10 mL

## 2014-03-29 MED ORDER — FENTANYL CITRATE 0.05 MG/ML IJ SOLN
INTRAMUSCULAR | Status: DC | PRN
  Administered 2014-03-29: 50 ug via INTRAVENOUS

## 2014-03-29 MED ORDER — 0.9 % SODIUM CHLORIDE (POUR BTL) OPTIME
TOPICAL | Status: DC | PRN
  Administered 2014-03-29: 1000 mL

## 2014-03-29 MED ORDER — LIDOCAINE HCL (CARDIAC) 20 MG/ML IV SOLN
INTRAVENOUS | Status: DC | PRN
  Administered 2014-03-29: 80 mg via INTRAVENOUS

## 2014-03-29 MED ORDER — CEFAZOLIN SODIUM 1-5 GM-% IV SOLN
1.0000 g | Freq: Once | INTRAVENOUS | Status: AC
  Administered 2014-03-29: 1 g via INTRAVENOUS

## 2014-03-29 MED ORDER — HYDROCODONE-ACETAMINOPHEN 5-325 MG PO TABS: 2.0000 | ORAL_TABLET | Freq: Four times a day (QID) | ORAL | Status: DC | PRN

## 2014-03-29 MED ORDER — LACTATED RINGERS IV SOLN
INTRAVENOUS | Status: DC
  Administered 2014-03-29 (×2): via INTRAVENOUS

## 2014-03-29 MED ORDER — ONDANSETRON HCL 4 MG/2ML IJ SOLN
INTRAMUSCULAR | Status: DC | PRN
  Administered 2014-03-29: 4 mg via INTRAVENOUS

## 2014-03-29 MED ORDER — PROPOFOL 10 MG/ML IV BOLUS
INTRAVENOUS | Status: DC | PRN
  Administered 2014-03-29: 200 mg via INTRAVENOUS

## 2014-03-29 MED ORDER — CEFAZOLIN SODIUM 1-5 GM-% IV SOLN
INTRAVENOUS | Status: AC
  Administered 2014-03-29: 1 g via INTRAVENOUS
  Filled 2014-03-29: qty 50

## 2014-03-29 MED ORDER — HYDROMORPHONE HCL 1 MG/ML IJ SOLN: 0.2500 mg | INTRAMUSCULAR | Status: DC | PRN

## 2014-03-29 MED ORDER — CEFAZOLIN SODIUM 1-5 GM-% IV SOLN
INTRAVENOUS | Status: DC | PRN
  Administered 2014-03-29: 1 g via INTRAVENOUS

## 2014-03-29 MED ORDER — MIDAZOLAM HCL 5 MG/5ML IJ SOLN
INTRAMUSCULAR | Status: DC | PRN
  Administered 2014-03-29: 2 mg via INTRAVENOUS

## 2014-03-29 SURGICAL SUPPLY — 57 items
BANDAGE ELASTIC 3 VELCRO ST LF (GAUZE/BANDAGES/DRESSINGS) ×3 IMPLANT
BANDAGE ELASTIC 4 VELCRO ST LF (GAUZE/BANDAGES/DRESSINGS) ×3 IMPLANT
BNDG COHESIVE 1X5 TAN STRL LF (GAUZE/BANDAGES/DRESSINGS) IMPLANT
BNDG CONFORM 3 STRL LF (GAUZE/BANDAGES/DRESSINGS) ×6 IMPLANT
BNDG GAUZE ELAST 4 BULKY (GAUZE/BANDAGES/DRESSINGS) ×3 IMPLANT
CORDS BIPOLAR (ELECTRODE) ×3 IMPLANT
COVER LIGHT HANDLE STERIS (MISCELLANEOUS) ×3 IMPLANT
COVER SURGICAL LIGHT HANDLE (MISCELLANEOUS) ×3 IMPLANT
CUFF TOURNIQUET SINGLE 18IN (TOURNIQUET CUFF) ×3 IMPLANT
CUFF TOURNIQUET SINGLE 24IN (TOURNIQUET CUFF) IMPLANT
DECANTER SPIKE VIAL GLASS SM (MISCELLANEOUS) ×3 IMPLANT
DRAPE SURG 17X23 STRL (DRAPES) ×3 IMPLANT
DRSG EMULSION OIL 3X3 NADH (GAUZE/BANDAGES/DRESSINGS) ×3 IMPLANT
GAUZE SPONGE 2X2 8PLY STRL LF (GAUZE/BANDAGES/DRESSINGS) IMPLANT
GAUZE SPONGE 4X4 12PLY STRL (GAUZE/BANDAGES/DRESSINGS) IMPLANT
GAUZE XEROFORM 1X8 LF (GAUZE/BANDAGES/DRESSINGS) ×3 IMPLANT
GLOVE BIOGEL M STRL SZ7.5 (GLOVE) ×3 IMPLANT
GLOVE BIOGEL PI IND STRL 6.5 (GLOVE) ×1 IMPLANT
GLOVE BIOGEL PI INDICATOR 6.5 (GLOVE) ×2
GLOVE ECLIPSE 6.5 STRL STRAW (GLOVE) ×3 IMPLANT
GLOVE ORTHOPEDIC STR SZ6.5 (GLOVE) ×3 IMPLANT
GLOVE SS BIOGEL STRL SZ 8 (GLOVE) ×1 IMPLANT
GLOVE SUPERSENSE BIOGEL SZ 8 (GLOVE) ×2
GOWN STRL REUS W/ TWL LRG LVL3 (GOWN DISPOSABLE) ×2 IMPLANT
GOWN STRL REUS W/ TWL XL LVL3 (GOWN DISPOSABLE) ×3 IMPLANT
GOWN STRL REUS W/TWL LRG LVL3 (GOWN DISPOSABLE) ×4
GOWN STRL REUS W/TWL XL LVL3 (GOWN DISPOSABLE) ×6
KIT BASIN OR (CUSTOM PROCEDURE TRAY) ×3 IMPLANT
KIT ROOM TURNOVER OR (KITS) ×3 IMPLANT
MANIFOLD NEPTUNE II (INSTRUMENTS) ×3 IMPLANT
NEEDLE HYPO 25GX1X1/2 BEV (NEEDLE) IMPLANT
NS IRRIG 1000ML POUR BTL (IV SOLUTION) ×3 IMPLANT
PACK ORTHO EXTREMITY (CUSTOM PROCEDURE TRAY) ×3 IMPLANT
PAD ARMBOARD 7.5X6 YLW CONV (MISCELLANEOUS) ×6 IMPLANT
PAD CAST 4YDX4 CTTN HI CHSV (CAST SUPPLIES) ×2 IMPLANT
PADDING CAST COTTON 4X4 STRL (CAST SUPPLIES) ×4
SOLUTION BETADINE 4OZ (MISCELLANEOUS) ×3 IMPLANT
SPECIMEN JAR SMALL (MISCELLANEOUS) ×3 IMPLANT
SPLINT FIBERGLASS 3X35 (CAST SUPPLIES) ×3 IMPLANT
SPONGE GAUZE 2X2 STER 10/PKG (GAUZE/BANDAGES/DRESSINGS)
SPONGE GAUZE 4X4 12PLY STER LF (GAUZE/BANDAGES/DRESSINGS) ×3 IMPLANT
SPONGE SCRUB IODOPHOR (GAUZE/BANDAGES/DRESSINGS) ×3 IMPLANT
SUCTION FRAZIER TIP 10 FR DISP (SUCTIONS) IMPLANT
SUT MERSILENE 4 0 P 3 (SUTURE) IMPLANT
SUT PROLENE 4 0 PS 2 18 (SUTURE) IMPLANT
SUT PROLENE 5 0 P 3 (SUTURE) ×9 IMPLANT
SUT PROLENE 6 0 P 1 18 (SUTURE) ×3 IMPLANT
SUT PROLENE 6 0 PC 1 (SUTURE) ×9 IMPLANT
SUT VIC AB 2-0 CT1 27 (SUTURE)
SUT VIC AB 2-0 CT1 TAPERPNT 27 (SUTURE) IMPLANT
SYR CONTROL 10ML LL (SYRINGE) IMPLANT
TOWEL OR 17X24 6PK STRL BLUE (TOWEL DISPOSABLE) ×3 IMPLANT
TOWEL OR 17X26 10 PK STRL BLUE (TOWEL DISPOSABLE) ×3 IMPLANT
TUBE CONNECTING 12'X1/4 (SUCTIONS)
TUBE CONNECTING 12X1/4 (SUCTIONS) IMPLANT
UNDERPAD 30X30 INCONTINENT (UNDERPADS AND DIAPERS) ×3 IMPLANT
WATER STERILE IRR 1000ML POUR (IV SOLUTION) ×3 IMPLANT

## 2014-03-29 NOTE — Discharge Instructions (Signed)

## 2014-03-29 NOTE — H&P (Signed)
Suzanne Holt is an 24 y.o. female.   Chief Complaint: Left small finger flexor tendon laceration HPI: Patient presents for irrigation debridement and repair left small finger tendon laceration  She understands risk and benefits and desires to proceed.    Past Medical History  Diagnosis Date  . Bipolar 1 disorder   . Headache   . Anemia     Past Surgical History  Procedure Laterality Date  . Bartholin cyst marsupialization N/A 07/17/2013    Procedure: BARTHOLIN CYST MARSUPIALIZATION;  Surgeon: Essie Hart, MD;  Location: [facility];  Service: Gynecology;  Laterality: N/A;  . Dilation and curettage of uterus  07/17/2013    Procedure: Bartholin Cyst culture;  Surgeon: Essie Hart, MD;  Location: [facility];  Service: Gynecology;;  . Wisdom tooth extraction      Family History  Problem Relation Age of Onset  . Heart disease Mother   . Hypertension Father   . Diabetes Other    Social History:  reports that she has been smoking.  She has never used smokeless tobacco. She reports that she drinks alcohol. She reports that she uses illicit drugs (Marijuana).  Allergies: No Known Allergies  Medications Prior to Admission  Medication Sig Dispense Refill  . cephALEXin (KEFLEX) 250 MG capsule Take 1 capsule (250 mg total) by mouth 4 (four) times daily. 28 capsule 0  . ibuprofen (ADVIL,MOTRIN) 600 MG tablet Take 1 tablet (600 mg total) by mouth every 6 (six) hours as needed. 30 tablet 0    Results for orders placed or performed during the hospital encounter of 03/29/14 (from the past 48 hour(s))  hCG, serum, qualitative     Status: None   Collection Time: 03/29/14  8:07 AM  Result Value Ref Range   Preg, Serum NEGATIVE NEGATIVE    Comment:        THE SENSITIVITY OF THIS METHODOLOGY IS >10 mIU/mL.   CBC     Status: None   Collection Time: 03/29/14  8:07 AM  Result Value Ref Range   WBC 7.7 4.0 - 10.5 K/uL   RBC 4.47 3.87 - 5.11 MIL/uL   Hemoglobin 12.9 12.0 - 15.0 g/dL   HCT 16.1 09.6 -  04.5 %   MCV 85.7 78.0 - 100.0 fL   MCH 28.9 26.0 - 34.0 pg   MCHC 33.7 30.0 - 36.0 g/dL   RDW 40.9 81.1 - 91.4 %   Platelets 217 150 - 400 K/uL   No results found.  Review of Systems  Respiratory: Negative.   Genitourinary: Negative.   Neurological: Negative.     Last menstrual period 02/22/2014. Physical Exam patient has left small finger tendon laceration about the flexor apparatus. Chest 1 cm laceration over the PIP joint volarly. There is no signs of infection no signs of instability The patient is alert and oriented in no acute distress the patient complains of pain in the affected upper extremity.  The patient is noted to have a normal HEENT exam.  Lung fields show equal chest expansion and no shortness of breath  abdomen exam is nontender without distention.  Lower extremity examination does not show any fracture dislocation or blood clot symptoms.  Pelvis is stable neck and back are stable and nontender Assessment/Plan Plan for left small finger flexor repair is necessary. She understands the postop recovery. She understands do's and don'ts timeframe duration of recovery and understands necessary therapy as well as the active extension and passive flexion protocol. We will do everything in our power  to give her the best finger possible We are planning surgery for your upper extremity. The risk and benefits of surgery include risk of bleeding infection anesthesia damage to normal structures and failure of the surgery to accomplish its intended goals of relieving symptoms and restoring function with this in mind we'll going to proceed. I have specifically discussed with the patient the pre-and postoperative regime and the does and don'ts and risk and benefits in great detail. Risk and benefits of surgery also include risk of dystrophy chronic nerve pain failure of the healing process to go onto completion and other inherent risks of surgery The relavent the pathophysiology of the  disease/injury process, as well as the alternatives for treatment and postoperative course of action has been discussed in great detail with the patient who desires to proceed.  We will do everything in our power to help you (the patient) restore function to the upper extremity. Is a pleasure to see this patient today.  Saidi Santacroce III,Marchell Froman M 03/29/2014, 1:05 PM

## 2014-03-29 NOTE — Transfer of Care (Signed)
Immediate Anesthesia Transfer of Care Note  Patient: Suzanne Holt  Procedure(s) Performed: Procedure(s): LEFT SMALL FINGER FLEXOR DIGITORIUM PROSUNDUS/FLEXOR DIGITORIUM SUPERFICIALIS AS NECESSARY TENDON FLEXOR REPAIR (Left)  Patient Location: PACU  Anesthesia Type:General  Level of Consciousness: awake and alert   Airway & Oxygen Therapy: Patient Spontanous Breathing and Patient connected to nasal cannula oxygen  Post-op Assessment: Report given to PACU RN and Post -op Vital signs reviewed and stable  Post vital signs: Reviewed and stable  Complications: No apparent anesthesia complications

## 2014-03-29 NOTE — Op Note (Signed)
NAME:  Suzanne Holt, Suzanne Holt            ACCOUNT NO.:  [id number]  MEDICAL RECORD NO.:  [id number]  LOCATION:  [facility]                         FACILITY:  [facility]  PHYSICIAN:  Dionne AnoWilliam M. Ixel Boehning, M.D.DATE OF BIRTH:  [birth year]  DATE OF PROCEDURE: DATE OF DISCHARGE:  03/29/2014                              OPERATIVE REPORT   PREOPERATIVE DIAGNOSES:  Left small finger flexor digitorum profundus laceration, rule out flexor digitorum superficialis laceration.  POSTOPERATIVE DIAGNOSES: 1. Complete laceration of flexor digitorum profundus tendon, left     small finger. 2. Flexor digitorum superficialis tendon constriction and adhesions,     but notable stability and no evidence of complete laceration about     the flexor digitorum superficialis.  SURGICAL PROCEDURES PERFORMED: 1. Irrigation and debridement of skin, subcutaneous tissue, tendon,     and associated soft tissue.  This was an excisional debridement     with curette, scissor tip and knife blade. 2. Tenolysis, tenosynovectomy of flexor digitorum superficialis, left     small finger. 3. Zone 2 flexor digitorum profundus repair utilizing a 4-strand     FiberWire technique. 4. A4 pulley reconstruction.  SURGEON:  Dionne AnoWilliam M. Amanda PeaGramig, M.D.  ASSISTANT:  None.  COMPLICATION:  None.  ANESTHESIA:  General.  TOURNIQUET TIME:  Less than an hour.  INDICATIONS:  A 24 year old female, presents with the above-mentioned diagnosis.  I have counseled her in regard to risks and benefits of surgery including risk of infection, bleeding, anesthesia, damage to normal structures, and failure of surgery to accomplish its intended goals, relieving symptoms, and restoring function.  With this in mind, she desires to proceed.  All questions have been encouraged and answered preoperatively.  OPERATIVE PROCEDURE:  The patient was seen by myself and Anesthesia, taken to the operative suite, underwent smooth induction of general anesthetic.  Prepped and  draped in usual sterile fashion.  Time-out was called.  Pre and postop check list complete.  Ancef was given and following this, the patient underwent a very careful and cautious approach to the extremity.  Sutures were removed.  Following this, I then performed modified Brunner incision about the left small finger. Skin flaps were elevated.  Following this, the flexor system was identified.  I took great care to protect the neurovascular structures. I opened the sheath in sole about A30 and in the portions of A4, which were disrupted significantly.  Following this, I retrieved the FDP tendon distally and identified it proximally.  I then performed evaluation of the FDS, which was caught up in hematoma and scar-type tissue, but was intact.  At this time, we placed multiple liters of saline through the wound and performed the irrigation and debridement.  This was an excisional debridement with scissor tip and knife blade of course.  Following freshening the area up and performing the I and D, we then turned attention towards the FDS.  The FDS underwent a tenolysis, tenosynovectomy, and was freed up.  I then placed the sutures in the FDP tendon.  This was a modified Kessler to [employer]LifeJamie stitch with 4-0 FiberWire.  Following this, we then threaded the tendon through the pulley system and following this, held it still with 25-gauge needle.  Once this  was complete, we then performed placement of a core stitch of FiberWire in the distal tendon.  This was overlying a 4, which was encroached upon.  The patient then had the 6-0 Prolene used on the most ulnar aspect and on the back wall of the tendon.  I then placed an additional core stitch of FiberWire to my satisfaction utilizing a modified Kessler stitch. The notch was then tied within the substance of the tendon without difficulty.  Following this, 6-0 Prolene was then taken through the top side or volar side all of the tendon.  The tendon  looked quite well.  I was very pleased with the repair.  Thus, FDP repair and FDS tenosynovectomy were performed.  The A4 pulley was incompetent and thus, I performed a modified Z- lengthening of portions of the A4 and A3, mobilized this and then oversewed the tendon with 6-0 Prolene.  This allowed for a better tendon excursion to prevent bowstring and other issues.  The patient tolerated this well and there were no complicating features.  Once this was complete, the patient then underwent a very careful and cautious approach to the wound closure with tourniquet deflation. Hemostasis being secured and closure of the wound with 5-0 Prolene.  The patient tolerated this well with passive tenodesis effect.  The patient had full excursion in terms of the motion and there was no bunching or problems with the tendon.  We will start her on a flexor tendon protocol in my office.  We will reach out to her, so we can see her in 7-10 days.  She will finish her Keflex.  I give her an additional gram of Ancef in the recovery room and she has pain medicine for pain management, postop.     Dionne Ano. Amanda Pea, M.D.     [facility]  D:  03/29/2014  T:  03/29/2014  Job:  [id number]

## 2014-03-29 NOTE — Anesthesia Postprocedure Evaluation (Signed)
  Anesthesia Post-op Note  Patient: Suzanne Holt  Procedure(s) Performed: Procedure(s): LEFT SMALL FINGER FLEXOR DIGITORIUM PROSUNDUS/FLEXOR DIGITORIUM SUPERFICIALIS AS NECESSARY TENDON FLEXOR REPAIR (Left)  Patient Location: PACU  Anesthesia Type:General  Level of Consciousness: awake  Airway and Oxygen Therapy: Patient Spontanous Breathing  Post-op Pain: mild  Post-op Assessment: Post-op Vital signs reviewed  Post-op Vital Signs: Reviewed  Last Vitals:  Filed Vitals:   03/29/14 1450  BP: 117/68  Pulse: 86  Temp:   Resp: 14    Complications: No apparent anesthesia complications

## 2014-03-29 NOTE — Anesthesia Procedure Notes (Signed)
Procedure Name: LMA Insertion Date/Time: 03/29/2014 1:10 PM Performed by: Sarita HaverFLOWERS, Pasqualino Witherspoon T Pre-anesthesia Checklist: Patient identified, Emergency Drugs available, Suction available, Patient being monitored and Timeout performed Patient Re-evaluated:Patient Re-evaluated prior to inductionOxygen Delivery Method: Circle system utilized and Simple face mask Preoxygenation: Pre-oxygenation with 100% oxygen Intubation Type: IV induction Ventilation: Mask ventilation without difficulty LMA: LMA inserted LMA Size: 4.0 Number of attempts: 1 Airway Equipment and Method: Patient positioned with wedge pillow Placement Confirmation: positive ETCO2 and breath sounds checked- equal and bilateral Tube secured with: Tape Dental Injury: Teeth and Oropharynx as per pre-operative assessment

## 2014-03-29 NOTE — Anesthesia Preprocedure Evaluation (Signed)
Anesthesia Evaluation  Patient identified by MRN, date of birth, ID band Patient awake    Airway Mallampati: II       Dental   Pulmonary Current Smoker,  breath sounds clear to auscultation        Cardiovascular negative cardio ROS  Rhythm:Regular Rate:Normal     Neuro/Psych    GI/Hepatic negative GI ROS, Neg liver ROS,   Endo/Other  negative endocrine ROS  Renal/GU negative Renal ROS     Musculoskeletal   Abdominal   Peds  Hematology  (+) anemia ,   Anesthesia Other Findings   Reproductive/Obstetrics                             Anesthesia Physical Anesthesia Plan  ASA: II  Anesthesia Plan: General   Post-op Pain Management:    Induction: Intravenous  Airway Management Planned: LMA  Additional Equipment:   Intra-op Plan:   Post-operative Plan: Extubation in OR  Informed Consent: I have reviewed the patients History and Physical, chart, labs and discussed the procedure including the risks, benefits and alternatives for the proposed anesthesia with the patient or authorized representative who has indicated his/her understanding and acceptance.     Plan Discussed with: CRNA and Anesthesiologist  Anesthesia Plan Comments:         Anesthesia Quick Evaluation

## 2014-03-29 NOTE — Op Note (Signed)
See dictation #[id number] Amanda PeaGramig MD

## 2014-03-31 ENCOUNTER — Encounter (HOSPITAL_COMMUNITY): Payer: Self-pay | Admitting: Orthopedic Surgery

## 2015-01-16 ENCOUNTER — Encounter (HOSPITAL_COMMUNITY): Payer: Self-pay | Admitting: *Deleted

## 2015-01-16 ENCOUNTER — Emergency Department (HOSPITAL_COMMUNITY)
Admission: EM | Admit: 2015-01-16 | Discharge: 2015-01-16 | Disposition: A | Discharge status: Home / Self Care | Payer: 59 | Attending: Emergency Medicine | Admitting: Emergency Medicine

## 2015-01-16 DIAGNOSIS — Z8659 Personal history of other mental and behavioral disorders: Secondary | ICD-10-CM | POA: Insufficient documentation

## 2015-01-16 DIAGNOSIS — Z872 Personal history of diseases of the skin and subcutaneous tissue: Secondary | ICD-10-CM | POA: Insufficient documentation

## 2015-01-16 DIAGNOSIS — R1031 Right lower quadrant pain: Secondary | ICD-10-CM | POA: Insufficient documentation

## 2015-01-16 DIAGNOSIS — Z862 Personal history of diseases of the blood and blood-forming organs and certain disorders involving the immune mechanism: Secondary | ICD-10-CM | POA: Insufficient documentation

## 2015-01-16 DIAGNOSIS — F17200 Nicotine dependence, unspecified, uncomplicated: Secondary | ICD-10-CM | POA: Insufficient documentation

## 2015-01-16 MED ORDER — IBUPROFEN 600 MG PO TABS: 600.0000 mg | ORAL_TABLET | Freq: Four times a day (QID) | ORAL | Status: DC | PRN

## 2015-01-16 MED ORDER — SULFAMETHOXAZOLE-TRIMETHOPRIM 800-160 MG PO TABS: 2.0000 | ORAL_TABLET | Freq: Two times a day (BID) | ORAL | Status: AC

## 2015-01-16 MED ORDER — IBUPROFEN 800 MG PO TABS
800.0000 mg | ORAL_TABLET | Freq: Once | ORAL | Status: AC
  Administered 2015-01-16: 800 mg via ORAL
  Filled 2015-01-16: qty 1

## 2015-01-16 NOTE — ED Notes (Signed)
Pt reports abscess or cyst to right groin/inner thigh area. Also woke up this am with swelling to right ankle.

## 2015-01-16 NOTE — ED Notes (Signed)
Assisted provider in ultra-sounding leg.

## 2015-01-16 NOTE — ED Provider Notes (Signed)
CSN: [id number]     Arrival date & time 01/16/15  1639 History   First MD Initiated Contact with Patient 01/16/15 1729     Chief Complaint  Patient presents with  . Abscess     (Consider location/radiation/quality/duration/timing/severity/associated sxs/prior Treatment) HPI Suzanne Holt is a 24 y.o. female who comes in for evaluation of possible abscess. Patient reports she has eczema on the outside of her right shin, has been scratching over the past couple of days and has noticed that the area has turned red and has become slightly tender. She also reports noticing an area along her right groin is also become red and slightly tender. She denies any fevers, chills, nausea or vomiting, abdominal pain, urinary symptoms, change in bowel habits, numbness or weakness, chest pain or shortness of breath. She took ibuprofen at home for her symptoms and reports improvement. No other aggravating or modifying factors. No history of immunocompromise.  Past Medical History  Diagnosis Date  . Bipolar 1 disorder (HCC)   . Headache   . Anemia    Past Surgical History  Procedure Laterality Date  . Bartholin cyst marsupialization N/A 07/17/2013    Procedure: BARTHOLIN CYST MARSUPIALIZATION;  Surgeon: Essie Hart, MD;  Location: [facility];  Service: Gynecology;  Laterality: N/A;  . Dilation and curettage of uterus  07/17/2013    Procedure: Bartholin Cyst culture;  Surgeon: Essie Hart, MD;  Location: [facility];  Service: Gynecology;;  . Wisdom tooth extraction    . Flexor tendon repair Left 03/29/2014    Procedure: LEFT SMALL FINGER FLEXOR DIGITORIUM PROSUNDUS/FLEXOR DIGITORIUM SUPERFICIALIS AS NECESSARY TENDON FLEXOR REPAIR;  Surgeon: Dominica Severin, MD;  Location: [facility];  Service: Orthopedics;  Laterality: Left;   Family History  Problem Relation Age of Onset  . Heart disease Mother   . Hypertension Father   . Diabetes Other    Social History  Substance Use Topics  . Smoking status: Current Some Day  Smoker  . Smokeless tobacco: Never Used  . Alcohol Use: Yes     Comment: OCC   OB History    No data available     Review of Systems A 10 point review of systems was completed and was negative except for pertinent positives and negatives as mentioned in the history of present illness     Allergies  Review of patient's allergies indicates no known allergies.  Home Medications   Prior to Admission medications   Medication Sig Start Date End Date Taking? Authorizing Provider  HYDROcodone-acetaminophen (NORCO) 5-325 MG per tablet Take 2 tablets by mouth every 6 (six) hours as needed for moderate pain. 03/29/14   Dominica Severin, MD  ibuprofen (ADVIL,MOTRIN) 600 MG tablet Take 1 tablet (600 mg total) by mouth every 6 (six) hours as needed. 01/16/15   Joycie Peek, PA-C  sulfamethoxazole-trimethoprim (BACTRIM DS,SEPTRA DS) 800-160 MG tablet Take 2 tablets by mouth 2 (two) times daily. 01/16/15 01/23/15  Joycie Peek, PA-C   BP 117/66 mmHg  Pulse 93  Temp(Src) 98.1 F (36.7 C) (Oral)  Resp 18  SpO2 100%  LMP 01/05/2015 Physical Exam  Constitutional: She is oriented to person, place, and time. She appears well-developed and well-nourished.  African American female  HENT:  Head: Normocephalic and atraumatic.  Mouth/Throat: Oropharynx is clear and moist.  Eyes: Conjunctivae are normal. Pupils are equal, round, and reactive to light. Right eye exhibits no discharge. Left eye exhibits no discharge. No scleral icterus.  Neck: Neck supple.  Cardiovascular: Normal rate, regular rhythm  and normal heart sounds.   No tachycardia on my exam. Heart rate in the 90s  Pulmonary/Chest: Effort normal and breath sounds normal. No respiratory distress. She has no wheezes. She has no rales.  Abdominal: Soft. There is no tenderness.  Musculoskeletal: She exhibits no tenderness.  Neurological: She is alert and oriented to person, place, and time.  Cranial Nerves II-XII grossly intact  Skin: Skin  is warm and dry. No rash noted.  Area of eczema along lateral aspect of distal right lower extremity. Small area of redness and mild tenderness. No draining bruising. No streaking. Distal pulses intact. Compartments are soft. Also, area of erythema parallel to right inguinal crease. Approximately 3 inches in length. Linear area and central area of erythema that is palpably dense, but grossly nontender.  Psychiatric: She has a normal mood and affect.  Nursing note and vitals reviewed.   ED Course  Procedures (including critical care time) EMERGENCY DEPARTMENT US SOFT TISSUE INTERPRETATION "Study: Limited Ultrasound of the noted body part in comments below"  INDICATIONS: Pain Multiple views of the body part are obtained with a multi-frequency linear probe  PERFORMED BY:  Myself  IMAGES ARCHIVED?: Yes  SIDE:Right   BODY PART:Lower extremity  FINDINGS: No abcess noted  LIMITATIONS:  Body Habitus  INTERPRETATION:  No cellulitis noted  COMMENT:  Right inguinal area of erythema and tenderness noted to be a lymph node   Labs Review Labs Reviewed - No data to display  Imaging Review No results found. I have personally reviewed and evaluated these images and lab results as part of my medical decision-making.   EKG Interpretation None     Meds given in ED:  Medications  ibuprofen (ADVIL,MOTRIN) tablet 800 mg (800 mg Oral Given 01/16/15 1913)    Discharge Medication List as of 01/16/2015  7:52 PM    START taking these medications   Details  sulfamethoxazole-trimethoprim (BACTRIM DS,SEPTRA DS) 800-160 MG tablet Take 2 tablets by mouth 2 (two) times daily., Starting 01/16/2015, Until Fri 01/23/15, Print       Filed Vitals:   01/16/15 1900 01/16/15 1915 01/16/15 1930 01/16/15 1945  BP: 114/71 129/69 123/61 117/66  Pulse: 92 105 85 93  Temp:      TempSrc:      Resp:      SpO2: 98% 99% 100% 100%    MDM  Vitals stable - WNL -afebrile Pt resting comfortably in  ED. PE--eczematous scar tissue on the lateral aspect of the distal right lower extremity with mild erythema. Apparent reactive lymph nodes and right inguinal crease. No evidence of overt cellulitis.  DDX--low suspicion for overt cellulitis, however due to reactive lymph nodes will treat with short course antibiotics and have patient follow-up with PCP in 3 days for reevaluation. Also given referral to community health and wellness patient may establish primary care. No evidence of other acute or emergent pathology at this time. Patient overall looks well, nontoxic and is appropriate for discharge.  I discussed all relevant lab findings and imaging results with pt and they verbalized understanding. Discussed f/u with PCP within 48 hrs and return precautions, pt very amenable to plan. Prior to patient discharge, I discussed and reviewed this case with Dr. Fayrene FearingJames   Final diagnoses:  Right groin pain        Joycie PeekBenjamin Eryca Bolte, PA-C 01/17/15 1535  Rolland PorterMark James, MD 01/28/15 [phone]

## 2015-01-16 NOTE — Discharge Instructions (Signed)
Please take all of your antibiotics as prescribed. Do not save or share them. Please follow-up with your doctor in 3 days for reevaluation. Return to ED for worsening symptoms including fevers, chills, worsening redness or swelling.

## 2015-08-05 ENCOUNTER — Inpatient Hospital Stay (HOSPITAL_COMMUNITY)
Admission: AD | Admit: 2015-08-05 | Discharge: 2015-08-05 | Disposition: A | Discharge status: Home / Self Care | Payer: Self-pay | Source: Ambulatory Visit | Attending: Obstetrics & Gynecology | Admitting: Obstetrics & Gynecology

## 2015-08-05 ENCOUNTER — Encounter (HOSPITAL_COMMUNITY): Payer: Self-pay

## 2015-08-05 DIAGNOSIS — N750 Cyst of Bartholin's gland: Secondary | ICD-10-CM

## 2015-08-05 DIAGNOSIS — F17290 Nicotine dependence, other tobacco product, uncomplicated: Secondary | ICD-10-CM | POA: Insufficient documentation

## 2015-08-05 DIAGNOSIS — Z3202 Encounter for pregnancy test, result negative: Secondary | ICD-10-CM | POA: Insufficient documentation

## 2015-08-05 LAB — URINALYSIS, ROUTINE W REFLEX MICROSCOPIC
Bilirubin Urine: NEGATIVE
Glucose, UA: NEGATIVE mg/dL
Ketones, ur: NEGATIVE mg/dL
LEUKOCYTES UA: NEGATIVE
NITRITE: NEGATIVE
PH: 6.5 (ref 5.0–8.0)
Protein, ur: NEGATIVE mg/dL
SPECIFIC GRAVITY, URINE: 1.02 (ref 1.005–1.030)

## 2015-08-05 LAB — POCT PREGNANCY, URINE: Preg Test, Ur: NEGATIVE

## 2015-08-05 LAB — URINE MICROSCOPIC-ADD ON: Bacteria, UA: NONE SEEN

## 2015-08-05 MED ORDER — LIDOCAINE HCL (PF) 1 % IJ SOLN
5.0000 mL | Freq: Once | INTRAMUSCULAR | Status: DC
  Filled 2015-08-05: qty 30

## 2015-08-05 MED ORDER — OXYCODONE-ACETAMINOPHEN 5-325 MG PO TABS: 2.0000 | ORAL_TABLET | ORAL | Status: DC | PRN

## 2015-08-05 MED ORDER — OXYCODONE-ACETAMINOPHEN 5-325 MG PO TABS
2.0000 | ORAL_TABLET | ORAL | Status: AC
  Administered 2015-08-05: 2 via ORAL
  Filled 2015-08-05: qty 2

## 2015-08-05 NOTE — Discharge Instructions (Signed)
Bartholin Cyst or Abscess A Bartholin cyst is a fluid-filled sac that forms on a Bartholin gland. Bartholin glands are small glands that are located within the folds of skin (labia) along the sides of the lower opening of the vagina. These glands produce a fluid to moisten the outside of the vagina during sexual intercourse. A Bartholin cyst causes a bulge on the side of the vagina. A cyst that is not large or infected may not cause symptoms or problems. However, if the fluid within the cyst becomes infected, the cyst can turn into an abscess. An abscess may cause discomfort or pain. CAUSES A Bartholin cyst may develop when the duct of the gland becomes blocked. In many cases, the cause of this is not known. Various kinds of bacteria can cause the cyst to become infected and develop into an abscess. RISK FACTORS You may be at an increased risk of developing a Bartholin cyst or abscess if:  You are a woman of reproductive age.  You have a history of previous Bartholin cysts or abscesses.  You have diabetes.  You have a sexually transmitted disease (STD). SIGNS AND SYMPTOMS The severity of symptoms varies depending on the size of the cyst and whether it is infected. Symptoms may include:  A bulge or swelling near the lower opening of your vagina.  Discomfort or pain.  Redness.  Pain during sexual intercourse.  Pain when walking.  Fluid draining from the area. DIAGNOSIS Your health care provider may make a diagnosis based on your symptoms and a physical exam. He or she will look for swelling in your vaginal area. Blood tests may be done to check for infections. A sample of fluid from the cyst or abscess may also be taken to be tested in a lab. TREATMENT Small cysts that are not infected may not require any treatment. These often go away on their own. Yourhealth care provider will recommend hot baths and the use of warm compresses. These may also be part of the treatment for an abscess.  Treatment options for a large cyst or abscess may include:   Antibiotic medicine.  A surgical procedure to drain the abscess. One of the following procedures may be done:  Incision and drainage. An incision is made in the cyst or abscess so that the fluid drains out. A catheter may be placed inside the cyst so that it does not close and fill up with fluid again. The catheter will be removed after you have a follow-up visit with a specialist (gynecologist).  Marsupialization. The cyst or abscess is opened and kept open by stitching the edges of the skin to the walls of the cyst or abscess. This allows it to continue to drain and not fill up with fluid again. If you have cysts or abscesses that keep returning and have required incision and drainage multiple times, your health care provider may talk to you about surgery to remove the Bartholin gland. HOME CARE INSTRUCTIONS  Take medicines only as directed by your health care provider.  If you were prescribed an antibiotic medicine, finish it all even if you start to feel better.  Apply warm, wet compresses to the area or take warm, shallow baths that cover your pelvic region (sitz baths) several times a day or as directed by your health care provider.  Do not squeeze the cyst or apply heavy pressure to it.  Do not have sexual intercourse until the cyst has gone away.  If your cyst or abscess was   opened, a small piece of gauze or a drain may have been placed in the area to allow drainage. Do not remove the gauze or the drain until directed by your health care provider.  Wear feminine pads--not tampons--as needed for any drainage or bleeding.  Keep all follow-up visits as directed by your health care provider. This is important. PREVENTION Take these steps to help prevent a Bartholin cyst from returning:  Practice good hygiene.   Clean your vaginal area with mild soap and a soft cloth when you bathe.  Practice safe sex to prevent  STDs. SEEK MEDICAL CARE IF:  You have increased pain, swelling, or redness in the area of the cyst.  Puslike drainage is coming from the cyst.  You have a fever.   This information is not intended to replace advice given to you by your health care provider. Make sure you discuss any questions you have with your health care provider.   Document Released: 02/28/2005 Document Revised: 03/21/2014 Document Reviewed: 10/14/2013 Elsevier Interactive Patient Education 2016 [employer]  

## 2015-08-05 NOTE — MAU Note (Signed)
Had Bartholin cyst in the past that was drained.  Pain started worsening 2 days ago

## 2015-08-05 NOTE — MAU Note (Signed)
Has a bartholin's cyst on rt labia,hx of same about a year ago.  Has noted swelling at different times.  Became intense on Sunday

## 2015-08-05 NOTE — MAU Provider Note (Signed)
Chief Complaint: Bartholin's Cyst  First Provider Initiated Contact with Patient 08/05/15 1110      SUBJECTIVE HPI: Suzanne Holt is a 25 y.o. G0P0000 who presents to maternity admissions with a Bartholin cyst. It first appeared in 2014 and was drained in 2016. Four days ago, the cyst appeared again, and two days ago, it started becoming very painful. Patient reports that the cyst is currently larger and more painful than it was last year. It is located on the right side. Patient reports pain with urination and has not been able to have a bowel movement in the past couple of days due to the intense pain. She has taken tylenol for pain, which does not help.  She has not tried any other treatments.  Patient is otherwise healthy. She denies fevers, headache, dizziness, chest pain, shortness of breath, abdominal pain, vaginal bleeding or itching/burning.  Past Medical History  Diagnosis Date  . Bipolar 1 disorder (HCC)   . Headache   . Anemia    Past Surgical History  Procedure Laterality Date  . Bartholin cyst marsupialization N/A 07/17/2013    Procedure: BARTHOLIN CYST MARSUPIALIZATION;  Surgeon: Essie HartWalda Pinn, MD;  Location: [facility];  Service: Gynecology;  Laterality: N/A;  . Dilation and curettage of uterus  07/17/2013    Procedure: Bartholin Cyst culture;  Surgeon: Essie HartWalda Pinn, MD;  Location: [facility];  Service: Gynecology;;  . Wisdom tooth extraction    . Flexor tendon repair Left 03/29/2014    Procedure: LEFT SMALL FINGER FLEXOR DIGITORIUM PROSUNDUS/FLEXOR DIGITORIUM SUPERFICIALIS AS NECESSARY TENDON FLEXOR REPAIR;  Surgeon: Dominica SeverinWilliam Gramig, MD;  Location: [facility];  Service: Orthopedics;  Laterality: Left;   Social History   Social History  . Marital Status: Single    Spouse Name: N/A  . Number of Children: N/A  . Years of Education: N/A   Occupational History  . Not on file.   Social History Main Topics  . Smoking status: Current Some Day Smoker -- 0.25 packs/day    Types: Cigars  .  Smokeless tobacco: Never Used  . Alcohol Use: Yes     Comment: OCC  . Drug Use: Yes    Special: Marijuana  . Sexual Activity: Yes    Birth Control/ Protection: None   Other Topics Concern  . Not on file   Social History Narrative   No current facility-administered medications on file prior to encounter.   No current outpatient prescriptions on file prior to encounter.   No Known Allergies  ROS:  Review of Systems  Constitutional: Negative for fever, chills and fatigue.  Respiratory: Negative for shortness of breath.   Cardiovascular: Negative for chest pain.  Genitourinary: Positive for vaginal pain. Negative for dysuria, flank pain, vaginal bleeding, vaginal discharge, difficulty urinating and pelvic pain.  Neurological: Negative for dizziness and headaches.  Psychiatric/Behavioral: Negative.      I have reviewed patient's Past Medical Hx, Surgical Hx, Family Hx, Social Hx, medications and allergies.   Physical Exam  Patient Vitals for the past 24 hrs:  BP Temp Temp src Pulse Resp Height Weight  08/05/15 1016 - - - - - 5\' 4"  (1.626 m) 125 lb (56.7 kg)  08/05/15 0947 113/66 mmHg 98.9 F (37.2 C) Oral 72 16 - -   Constitutional: Well-developed, well-nourished female in no acute distress.  Cardiovascular: normal rate Respiratory: normal effort GI: Abd soft, non-tender. Pos BS x 4 MS: Extremities nontender, no edema, normal ROM Neurologic: Alert and oriented x 4.  GU: Neg CVAT.  On visual inspection, large 4-5 cm right labial/Bartholin cyst that is tender to touch, soft, fluctuant.  No drainage noted.     LAB RESULTS Results for orders placed or performed during the hospital encounter of 08/05/15 (from the past 24 hour(s))  Urinalysis, Routine w reflex microscopic (not at [facility])     Status: Abnormal   Collection Time: 08/05/15  9:20 AM  Result Value Ref Range   Color, Urine YELLOW YELLOW   APPearance CLEAR CLEAR   Specific Gravity, Urine 1.020 1.005 - 1.030   pH  6.5 5.0 - 8.0   Glucose, UA NEGATIVE NEGATIVE mg/dL   Hgb urine dipstick TRACE (A) NEGATIVE   Bilirubin Urine NEGATIVE NEGATIVE   Ketones, ur NEGATIVE NEGATIVE mg/dL   Protein, ur NEGATIVE NEGATIVE mg/dL   Nitrite NEGATIVE NEGATIVE   Leukocytes, UA NEGATIVE NEGATIVE  Urine microscopic-add on     Status: Abnormal   Collection Time: 08/05/15  9:20 AM  Result Value Ref Range   Squamous Epithelial / LPF 0-5 (A) NONE SEEN   WBC, UA 0-5 0 - 5 WBC/hpf   RBC / HPF 0-5 0 - 5 RBC/hpf   Bacteria, UA NONE SEEN NONE SEEN   Urine-Other MUCOUS PRESENT   Pregnancy, urine POC     Status: None   Collection Time: 08/05/15 10:27 AM  Result Value Ref Range   Preg Test, Ur NEGATIVE NEGATIVE  I&D or draining bartholin's cyst right side- mod amount of purulent discharge obtained After consent obtained; betadine used to prep and lidocaine injected for local anethetic WORD catheter inserted and filled with 3 cc saline     IMAGING No results found.  MAU Management/MDM: After visual inspection of cyst, pt called out to report cyst has opened up spontaneously and drainage occuring in MAU.  Pamelia Hoit, NP, in room to evaluate and additional I&D performed at bedside, see note above.  Pt reported improvement in symptoms.  Pt stable at time of discharge.  ASSESSMENT  1. Cyst of right Bartholin's gland    PLAN Discharge home Percocet 5/325, take 2 tabs Q 4 hours PRN x 15 tabs  Follow-up Information    Follow up with [facility].   Specialty:  Obstetrics and Gynecology   Why:  the clniic will contact you for an appointment- return sooner if increase in pain or swelling   Contact information:   [address] [place] [address] [phone]        Medication List    TAKE these medications        oxyCODONE-acetaminophen 5-325 MG tablet  Commonly known as:  PERCOCET/ROXICET  Take 2 tablets by mouth every 4 (four) hours as needed for severe pain.          Medication List    Notice    You have not been prescribed any medications.       Sharen Counter Certified Nurse-Midwife 08/05/2015  11:28 AM

## 2015-08-31 ENCOUNTER — Encounter: Payer: Self-pay | Admitting: Obstetrics & Gynecology

## 2015-08-31 ENCOUNTER — Encounter: Payer: Self-pay | Admitting: *Deleted

## 2015-08-31 ENCOUNTER — Telehealth: Payer: Self-pay | Admitting: *Deleted

## 2015-08-31 NOTE — Telephone Encounter (Signed)
Suzanne Holt missed a scheduled appointment to follow up bartholin's cyst. I called and was unable to leave a message . Will send letter.

## 2015-11-05 ENCOUNTER — Encounter: Payer: Self-pay | Admitting: Obstetrics & Gynecology

## 2015-12-19 IMAGING — CR DG FINGER LITTLE 2+V*L*
3 series · 3 of 3 positions shown · non-contrast
Comparison: None.

CLINICAL DATA: Little finger laceration

EXAM:
LEFT LITTLE FINGER 2+V

[x finger pa left]
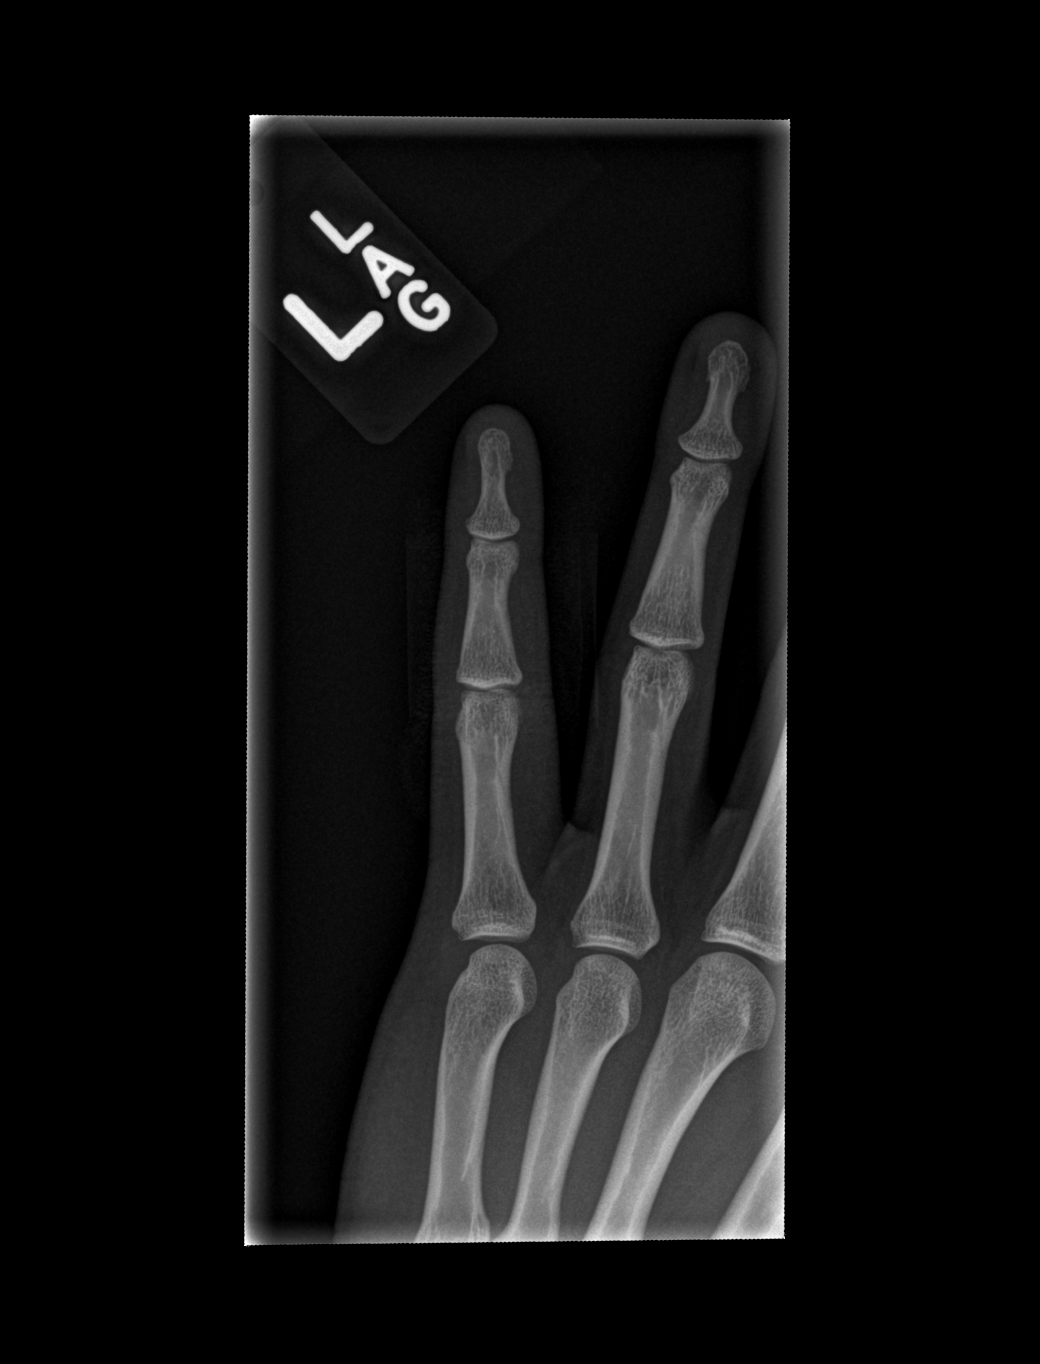

[x finger obl left]
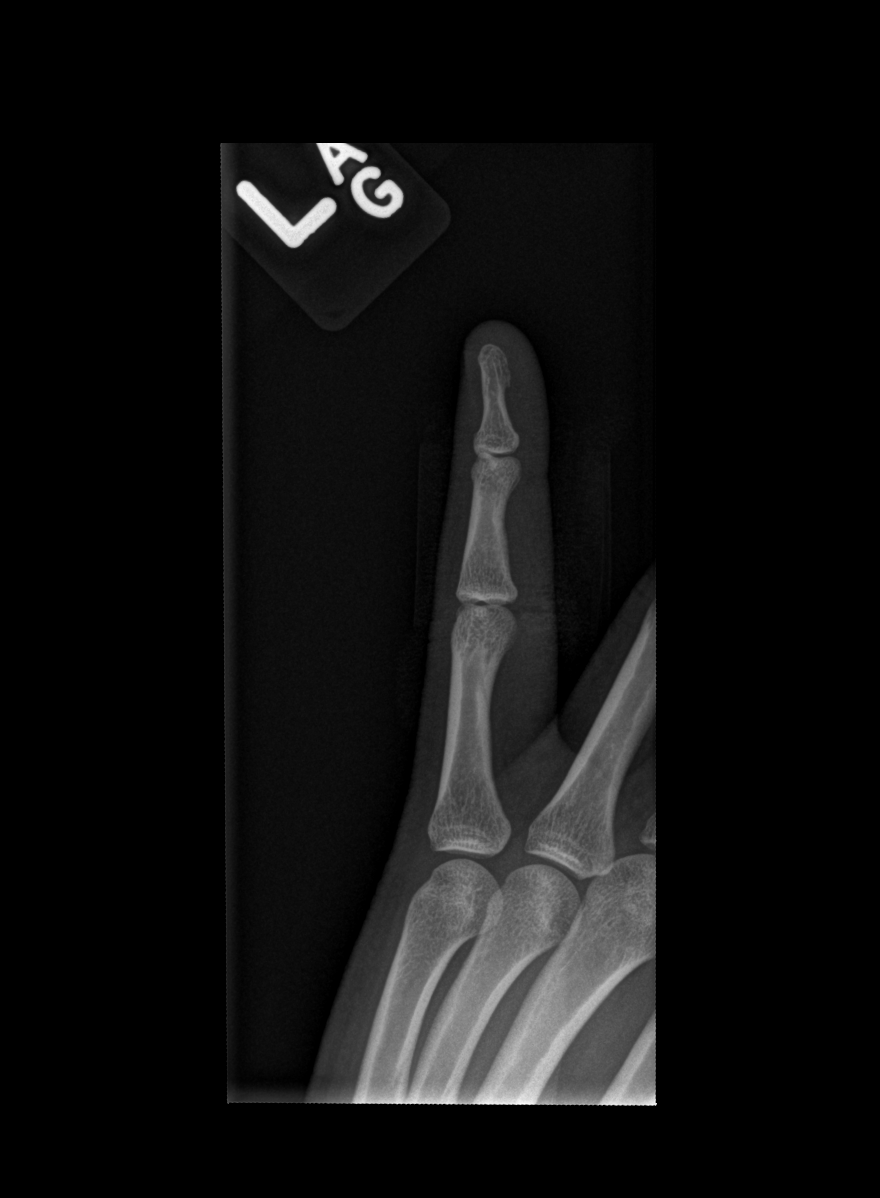

[x finger lat left]
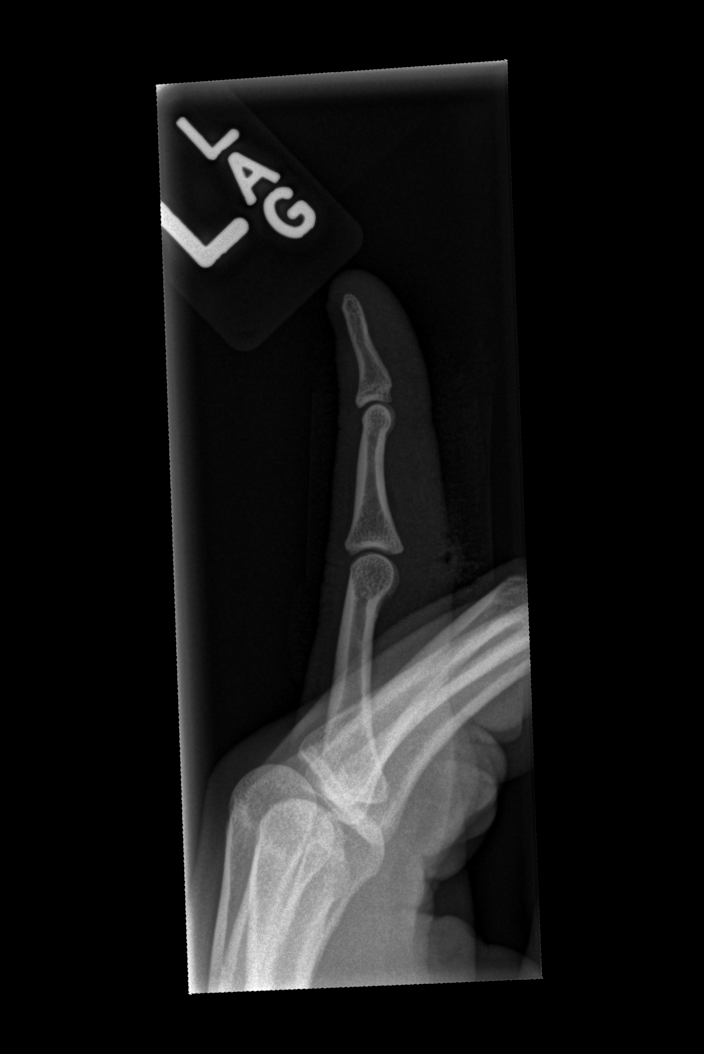

[3 of 3 positions shown; findings below may reference images not displayed]

FINDINGS: Three views of the left fifth finger submitted. No acute fracture or
subluxation. No radiopaque foreign body.
IMPRESSION: Negative.

## 2016-07-20 ENCOUNTER — Emergency Department (HOSPITAL_COMMUNITY): Payer: Self-pay

## 2016-07-20 ENCOUNTER — Emergency Department (HOSPITAL_COMMUNITY)
Admission: EM | Admit: 2016-07-20 | Discharge: 2016-07-20 | Disposition: A | Discharge status: Home / Self Care | Payer: Self-pay | Attending: Emergency Medicine | Admitting: Emergency Medicine

## 2016-07-20 ENCOUNTER — Encounter (HOSPITAL_COMMUNITY): Payer: Self-pay | Admitting: Emergency Medicine

## 2016-07-20 ENCOUNTER — Other Ambulatory Visit: Payer: Self-pay

## 2016-07-20 DIAGNOSIS — K2901 Acute gastritis with bleeding: Secondary | ICD-10-CM | POA: Insufficient documentation

## 2016-07-20 DIAGNOSIS — R109 Unspecified abdominal pain: Secondary | ICD-10-CM

## 2016-07-20 DIAGNOSIS — K2900 Acute gastritis without bleeding: Secondary | ICD-10-CM

## 2016-07-20 DIAGNOSIS — Z79899 Other long term (current) drug therapy: Secondary | ICD-10-CM | POA: Insufficient documentation

## 2016-07-20 DIAGNOSIS — F17290 Nicotine dependence, other tobacco product, uncomplicated: Secondary | ICD-10-CM | POA: Insufficient documentation

## 2016-07-20 LAB — CBC WITH DIFFERENTIAL/PLATELET
BASOS PCT: 0 %
Basophils Absolute: 0 10*3/uL (ref 0.0–0.1)
EOS ABS: 0 10*3/uL (ref 0.0–0.7)
EOS PCT: 0 %
HCT: 43.2 % (ref 36.0–46.0)
HEMOGLOBIN: 14.7 g/dL (ref 12.0–15.0)
Lymphocytes Relative: 15 %
Lymphs Abs: 2.1 10*3/uL (ref 0.7–4.0)
MCH: 29.7 pg (ref 26.0–34.0)
MCHC: 34 g/dL (ref 30.0–36.0)
MCV: 87.3 fL (ref 78.0–100.0)
MONOS PCT: 3 %
Monocytes Absolute: 0.4 10*3/uL (ref 0.1–1.0)
NEUTROS PCT: 82 %
Neutro Abs: 11.1 10*3/uL — ABNORMAL HIGH (ref 1.7–7.7)
PLATELETS: 246 10*3/uL (ref 150–400)
RBC: 4.95 MIL/uL (ref 3.87–5.11)
RDW: 12.7 % (ref 11.5–15.5)
WBC: 13.6 10*3/uL — AB (ref 4.0–10.5)

## 2016-07-20 LAB — URINALYSIS, ROUTINE W REFLEX MICROSCOPIC
Bilirubin Urine: NEGATIVE
GLUCOSE, UA: NEGATIVE mg/dL
Hgb urine dipstick: NEGATIVE
Ketones, ur: 5 mg/dL — AB
NITRITE: NEGATIVE
PH: 9 — AB (ref 5.0–8.0)
Protein, ur: 100 mg/dL — AB
SPECIFIC GRAVITY, URINE: 1.025 (ref 1.005–1.030)

## 2016-07-20 LAB — COMPREHENSIVE METABOLIC PANEL
ALBUMIN: 4.7 g/dL (ref 3.5–5.0)
ALT: 10 U/L — ABNORMAL LOW (ref 14–54)
ANION GAP: 12 (ref 5–15)
AST: 23 U/L (ref 15–41)
Alkaline Phosphatase: 62 U/L (ref 38–126)
BUN: 8 mg/dL (ref 6–20)
CALCIUM: 9.7 mg/dL (ref 8.9–10.3)
CHLORIDE: 99 mmol/L — AB (ref 101–111)
CO2: 26 mmol/L (ref 22–32)
Creatinine, Ser: 0.83 mg/dL (ref 0.44–1.00)
GFR calc Af Amer: >60 mL/min (ref >60)
GFR calc non Af Amer: >60 mL/min (ref >60)
GLUCOSE: 122 mg/dL — AB (ref 65–99)
POTASSIUM: 3.2 mmol/L — AB (ref 3.5–5.1)
SODIUM: 137 mmol/L (ref 135–145)
Total Bilirubin: 0.8 mg/dL (ref 0.3–1.2)
Total Protein: 7.5 g/dL (ref 6.5–8.1)

## 2016-07-20 LAB — LIPASE, BLOOD: Lipase: 123 U/L — ABNORMAL HIGH (ref 11–51)

## 2016-07-20 LAB — I-STAT BETA HCG BLOOD, ED (MC, WL, AP ONLY)

## 2016-07-20 LAB — I-STAT TROPONIN, ED: Troponin i, poc: 0 ng/mL (ref 0.00–0.08)

## 2016-07-20 LAB — PREGNANCY, URINE: Preg Test, Ur: NEGATIVE

## 2016-07-20 MED ORDER — ONDANSETRON HCL 4 MG/2ML IJ SOLN
4.0000 mg | Freq: Once | INTRAMUSCULAR | Status: AC
  Administered 2016-07-20: 4 mg via INTRAVENOUS
  Filled 2016-07-20: qty 2

## 2016-07-20 MED ORDER — SODIUM CHLORIDE 0.9 % IV BOLUS (SEPSIS)
1000.0000 mL | Freq: Once | INTRAVENOUS | Status: AC
  Administered 2016-07-20: 1000 mL via INTRAVENOUS

## 2016-07-20 MED ORDER — DICYCLOMINE HCL 10 MG PO CAPS
10.0000 mg | ORAL_CAPSULE | Freq: Once | ORAL | Status: AC
  Administered 2016-07-20: 10 mg via ORAL
  Filled 2016-07-20: qty 1

## 2016-07-20 MED ORDER — IOPAMIDOL (ISOVUE-300) INJECTION 61%
INTRAVENOUS | Status: AC
  Administered 2016-07-20: 80 mL
  Filled 2016-07-20: qty 100

## 2016-07-20 MED ORDER — ONDANSETRON 4 MG PO TBDP: 4.0000 mg | ORAL_TABLET | Freq: Three times a day (TID) | ORAL | 0 refills | Status: DC | PRN

## 2016-07-20 MED ORDER — MORPHINE SULFATE (PF) 4 MG/ML IV SOLN
4.0000 mg | Freq: Once | INTRAVENOUS | Status: AC
  Administered 2016-07-20: 4 mg via INTRAVENOUS
  Filled 2016-07-20: qty 1

## 2016-07-20 MED ORDER — OMEPRAZOLE 20 MG PO CPDR: 20.0000 mg | DELAYED_RELEASE_CAPSULE | Freq: Every day | ORAL | 0 refills | Status: DC

## 2016-07-20 NOTE — ED Notes (Signed)
Sprite was given to drink

## 2016-07-20 NOTE — ED Notes (Signed)
Pt reports that she has been having abd pain for the past 2 days. Pt reports that she has not had a bowel movement for the past 2 days either. Pt reports nausea and vomiting that started yesterday. Pt reports inability to eat or drink.

## 2016-07-20 NOTE — ED Triage Notes (Signed)
Patient reports mid abdominal pain with emesis onset yesterday and central chest pain this evening , denies diarrhea or SOB , vomited multiple times at triage.

## 2016-07-20 NOTE — Discharge Instructions (Signed)
You were seen today for vomiting and abdominal pain. You likely have some irritation of your stomach lining. He will be started on an acid reducer and a nausea medication. Follow-up with her primary physician if symptoms recur or worsen.

## 2016-07-20 NOTE — ED Notes (Signed)
Drank sprite states she feels ok.

## 2016-07-20 NOTE — ED Provider Notes (Signed)
[facility]-EMERGENCY DEPT Provider Note   CSN: [id number] Arrival date & time: 07/20/16  0150  By signing my name below, I, Linna Darner, attest that this documentation has been prepared under the direction and in the presence of physician practitioner, Kadeja Granada, Mayer Masker, MD. Electronically Signed: Linna Darner, Scribe. 07/20/2016. 2:18 AM.  History   Chief Complaint Chief Complaint  Patient presents with  . Abdominal Pain  . Emesis  . Chest Pain   The history is provided by the patient. No language interpreter was used.   HPI Comments: Suzanne Holt is a 26 y.o. female who presents to the Emergency Department complaining of intermittent, gradually worsening, cramping upper abdominal/periumbilical pain beginning two days ago. She reports some difficulty urinating, nausea and occasional episodes of vomiting beginning yesterday morning. Patient states she has not been able to tolerate any PO intake for the past 24 hours. She notes she has had some transient episodes of left-sided chest pain for a few hours. She rates her current abdominal pain at 9/10 in severity. No alleviating factors noted. No known ill contacts. Patient denies the possibility of pregnancy and her LMP was last month and regular. No h/o DM. She denies dysuria, hematuria, diarrhea, fevers, cough, congestion, or any other associated symptoms.  Past Medical History:  Diagnosis Date  . Anemia   . Bipolar 1 disorder (HCC)   . Headache     There are no active problems to display for this patient.   Past Surgical History:  Procedure Laterality Date  . BARTHOLIN CYST MARSUPIALIZATION N/A 07/17/2013   Procedure: BARTHOLIN CYST MARSUPIALIZATION;  Surgeon: Essie Hart, MD;  Location: [facility];  Service: Gynecology;  Laterality: N/A;  . DILATION AND CURETTAGE OF UTERUS  07/17/2013   Procedure: Bartholin Cyst culture;  Surgeon: Essie Hart, MD;  Location: [facility];  Service: Gynecology;;  . FLEXOR TENDON REPAIR Left 03/29/2014   Procedure: LEFT SMALL FINGER FLEXOR DIGITORIUM PROSUNDUS/FLEXOR DIGITORIUM SUPERFICIALIS AS NECESSARY TENDON FLEXOR REPAIR;  Surgeon: Dominica Severin, MD;  Location: [facility] OR;  Service: Orthopedics;  Laterality: Left;  . WISDOM TOOTH EXTRACTION      OB History    Gravida Para Term Preterm AB Living   0 0 0 0 0 0   SAB TAB Ectopic Multiple Live Births   0 0 0 0         Home Medications    Prior to Admission medications   Medication Sig Start Date End Date Taking? Authorizing Provider  omeprazole (PRILOSEC) 20 MG capsule Take 1 capsule (20 mg total) by mouth daily. 07/20/16   Amarie Tarte, Mayer Masker, MD  ondansetron (ZOFRAN ODT) 4 MG disintegrating tablet Take 1 tablet (4 mg total) by mouth every 8 (eight) hours as needed for nausea or vomiting. 07/20/16   Matalie Romberger, Mayer Masker, MD  oxyCODONE-acetaminophen (PERCOCET/ROXICET) 5-325 MG tablet Take 2 tablets by mouth every 4 (four) hours as needed for severe pain. Patient not taking: Reported on 07/20/2016 08/05/15   Jean Rosenthal, NP    Family History Family History  Problem Relation Age of Onset  . Heart disease Mother   . Hypertension Father   . Diabetes Other     Social History Social History  Substance Use Topics  . Smoking status: Current Some Day Smoker    Packs/day: 0.25    Types: Cigars  . Smokeless tobacco: Never Used  . Alcohol use Yes     Comment: OCC     Allergies   Patient has no known allergies.  Review of Systems Review of Systems  Constitutional: Negative for fever.  HENT: Negative for congestion.   Respiratory: Negative for cough.   Cardiovascular: Positive for chest pain.  Gastrointestinal: Positive for abdominal pain, nausea and vomiting. Negative for diarrhea.  Genitourinary: Positive for difficulty urinating. Negative for dysuria and hematuria.  All other systems reviewed and are negative.  Physical Exam Updated Vital Signs BP 107/64 (BP Location: Left Arm)   Pulse 88   Temp 98.4 F (36.9 C) (Oral)    Resp 16   LMP 06/27/2016   SpO2 100%   Physical Exam  Constitutional: She is oriented to person, place, and time. She appears well-developed and well-nourished. No distress.  HENT:  Head: Normocephalic and atraumatic.  Mucous membranes dry  Cardiovascular: Normal rate, regular rhythm and normal heart sounds.   Pulmonary/Chest: Effort normal. No respiratory distress. She has no wheezes.  Abdominal: Soft. Bowel sounds are normal. She exhibits no mass. There is tenderness. There is no guarding.  Epigastric tenderness to palpation without rebound or guarding  Neurological: She is alert and oriented to person, place, and time.  Skin: Skin is warm and dry.  Psychiatric: She has a normal mood and affect.  Nursing note and vitals reviewed.  ED Treatments / Results  Labs (all labs ordered are listed, but only abnormal results are displayed) Labs Reviewed  CBC WITH DIFFERENTIAL/PLATELET - Abnormal; Notable for the following:       Result Value   WBC 13.6 (*)    Neutro Abs 11.1 (*)    All other components within normal limits  COMPREHENSIVE METABOLIC PANEL - Abnormal; Notable for the following:    Potassium 3.2 (*)    Chloride 99 (*)    Glucose, Bld 122 (*)    ALT 10 (*)    All other components within normal limits  LIPASE, BLOOD - Abnormal; Notable for the following:    Lipase 123 (*)    All other components within normal limits  URINALYSIS, ROUTINE W REFLEX MICROSCOPIC - Abnormal; Notable for the following:    APPearance CLOUDY (*)    pH 9.0 (*)    Ketones, ur 5 (*)    Protein, ur 100 (*)    Leukocytes, UA TRACE (*)    Bacteria, UA RARE (*)    Squamous Epithelial / LPF 0-5 (*)    All other components within normal limits  PREGNANCY, URINE  I-STAT TROPOININ, ED  I-STAT BETA HCG BLOOD, ED ([facility], [facility], [facility] ONLY)    EKG  EKG Interpretation  Date/Time:  Wednesday Jul 20 2016 01:57:39 EDT Ventricular Rate:  104 PR Interval:  224 QRS Duration: 80 QT Interval:  314 QTC  Calculation: 412 R Axis:   58 Text Interpretation:  Sinus tachycardia with 1st degree A-V block Otherwise normal ECG Confirmed by Ross MarcusHorton, Hulan Szumski ([id number]) on 07/20/2016 3:16:42 AM       Radiology Dg Chest 2 View  Result Date: 07/20/2016 CLINICAL DATA:  Acute onset of generalized chest pain and shortness of breath. Abdominal cramping. Initial encounter. EXAM: CHEST  2 VIEW COMPARISON:  None. FINDINGS: The lungs are well-aerated and clear. There is no evidence of focal opacification, pleural effusion or pneumothorax. The heart is normal in size; the mediastinal contour is within normal limits. No acute osseous abnormalities are seen. IMPRESSION: No acute cardiopulmonary process seen. Electronically Signed   By: Roanna RaiderJeffery  Chang M.D.   On: 07/20/2016 02:45   Ct Abdomen Pelvis W Contrast  Result Date: 07/20/2016 CLINICAL DATA:  Abdominal  pain, cramping, and vomiting for 2 days. Leukocytosis. EXAM: CT ABDOMEN AND PELVIS WITH CONTRAST TECHNIQUE: Multidetector CT imaging of the abdomen and pelvis was performed using the standard protocol following bolus administration of intravenous contrast. CONTRAST:  80mL ISOVUE-300 IOPAMIDOL (ISOVUE-300) INJECTION 61% COMPARISON:  Ultrasound right upper quadrant 07/20/2016 FINDINGS: Lower chest: Lung bases are clear. Hepatobiliary: No focal liver abnormality is seen. No gallstones, gallbladder wall thickening, or biliary dilatation. Pancreas: Unremarkable. No pancreatic ductal dilatation or surrounding inflammatory changes. Spleen: Normal in size without focal abnormality. Adrenals/Urinary Tract: Adrenal glands are unremarkable. Kidneys are normal, without renal calculi, focal lesion, or hydronephrosis. Bladder is unremarkable. Stomach/Bowel: Mild wall thickening suggested in the pyloric region of the stomach possibly indicating gastritis. Small bowel are decompressed. Diffusely stool-filled colon without abnormal distention or wall thickening. Appendix is normal.  Vascular/Lymphatic: No significant vascular findings are present. No enlarged abdominal or pelvic lymph nodes. Reproductive: Uterus and bilateral adnexa are unremarkable. Other: No abdominal wall hernia or abnormality. No abdominopelvic ascites. Musculoskeletal: No acute or significant osseous findings. IMPRESSION: Suggestion of wall thickening in the pyloric region of the stomach. This may indicate gastritis. No evidence of bowel obstruction or inflammation. No acute process otherwise identified in the abdomen or pelvis. Electronically Signed   By: Burman Nieves M.D.   On: 07/20/2016 06:25   US Abdomen Limited Ruq  Result Date: 07/20/2016 CLINICAL DATA:  26 y/o  F; 3 days of abdominal pain with vomiting. EXAM: US ABDOMEN LIMITED - RIGHT UPPER QUADRANT COMPARISON:  None. FINDINGS: Gallbladder: No gallstones or wall thickening visualized. No sonographic Murphy sign noted by sonographer. Common bile duct: Diameter: 1.8 mm Liver: No focal lesion identified. Within normal limits in parenchymal echogenicity. IMPRESSION: Normal right upper quadrant ultrasound. Electronically Signed   By: Mitzi Hansen M.D.   On: 07/20/2016 04:07    Procedures Procedures (including critical care time)  DIAGNOSTIC STUDIES: Oxygen Saturation is 100% on RA, normal by my interpretation.    COORDINATION OF CARE: 2:22 AM Discussed treatment plan with pt at bedside and pt agreed to plan.  Medications Ordered in ED Medications  sodium chloride 0.9 % bolus 1,000 mL (0 mLs Intravenous Stopped 07/20/16 0713)  ondansetron (ZOFRAN) injection 4 mg (4 mg Intravenous Given 07/20/16 0319)  dicyclomine (BENTYL) capsule 10 mg (10 mg Oral Given 07/20/16 0310)  ondansetron (ZOFRAN) injection 4 mg (4 mg Intravenous Given 07/20/16 0418)  morphine 4 MG/ML injection 4 mg (4 mg Intravenous Given 07/20/16 0419)  iopamidol (ISOVUE-300) 61 % injection (80 mLs  Contrast Given 07/20/16 0557)     Initial Impression / Assessment and Plan / ED  Course  I have reviewed the triage vital signs and the nursing notes.  Pertinent labs & imaging results that were available during my care of the patient were reviewed by me and considered in my medical decision making (see chart for details).  Clinical Course as of Jul 22 819  Wed Jul 20, 2016  0422 A short with continued pain and nausea. Redosed medication. Ultrasound negative. She does have leukocytosis. Given continued symptoms refractory to medications, will obtain a CT scan.  [CH]    Clinical Course User Index [CH] Natilie Krabbenhoft, Mayer Masker, MD    Patient presents with 24 hours of vomiting. Also reports intermittent chest pain. She is ill-appearing but nontoxic on exam. Mildly dehydrated appearing. Patient was given pain and nausea medication. Review of triage lab work is largely reassuring. Initially she was given Bentyl and Zofran. Her lipase is minimally elevated  at 123. White count slightly elevated at 13.6. Her exam is diffuse pain.  Given lipase, right upper quadrant ultrasound was obtained. This is normal. On recheck, she reports refractory pain and vomiting. For this reason, CT scan was obtained. She was redosed medication. CT scan is reassuring. On recheck, she is resting comfortably and states that she feels much better. She is able to tolerate fluids. Suspect gastritis versus gastroenteritis. Will discharge home with Zofran.  After history, exam, and medical workup I feel the patient has been appropriately medically screened and is safe for discharge home. Pertinent diagnoses were discussed with the patient. Patient was given return precautions.   Final Clinical Impressions(s) / ED Diagnoses   Final diagnoses:  Abdominal pain  Acute superficial gastritis, presence of bleeding unspecified    New Prescriptions Discharge Medication List as of 07/20/2016  8:10 AM    START taking these medications   Details  omeprazole (PRILOSEC) 20 MG capsule Take 1 capsule (20 mg total) by mouth  daily., Starting Wed 07/20/2016, Print    ondansetron (ZOFRAN ODT) 4 MG disintegrating tablet Take 1 tablet (4 mg total) by mouth every 8 (eight) hours as needed for nausea or vomiting., Starting Wed 07/20/2016, Print       I personally performed the services described in this documentation, which was scribed in my presence. The recorded information has been reviewed and is accurate.    Shon Baton, MD 07/21/16 [phone]

## 2016-07-21 ENCOUNTER — Encounter (HOSPITAL_COMMUNITY): Payer: Self-pay | Admitting: Emergency Medicine

## 2016-07-21 ENCOUNTER — Emergency Department (HOSPITAL_COMMUNITY)
Admission: EM | Admit: 2016-07-21 | Discharge: 2016-07-21 | Disposition: A | Discharge status: Home / Self Care | Payer: Self-pay | Attending: Emergency Medicine | Admitting: Emergency Medicine

## 2016-07-21 DIAGNOSIS — F17290 Nicotine dependence, other tobacco product, uncomplicated: Secondary | ICD-10-CM | POA: Insufficient documentation

## 2016-07-21 DIAGNOSIS — Z79899 Other long term (current) drug therapy: Secondary | ICD-10-CM | POA: Insufficient documentation

## 2016-07-21 DIAGNOSIS — K2900 Acute gastritis without bleeding: Secondary | ICD-10-CM | POA: Insufficient documentation

## 2016-07-21 MED ORDER — SODIUM CHLORIDE 0.9 % IV BOLUS (SEPSIS)
1000.0000 mL | Freq: Once | INTRAVENOUS | Status: AC
  Administered 2016-07-21: 1000 mL via INTRAVENOUS

## 2016-07-21 MED ORDER — TRAMADOL HCL 50 MG PO TABS: 50.0000 mg | ORAL_TABLET | Freq: Four times a day (QID) | ORAL | 0 refills | Status: DC | PRN

## 2016-07-21 MED ORDER — PROMETHAZINE HCL 25 MG PO TABS: 25.0000 mg | ORAL_TABLET | Freq: Four times a day (QID) | ORAL | 0 refills | Status: DC | PRN

## 2016-07-21 MED ORDER — PANTOPRAZOLE SODIUM 40 MG IV SOLR
40.0000 mg | Freq: Once | INTRAVENOUS | Status: AC
  Administered 2016-07-21: 40 mg via INTRAVENOUS
  Filled 2016-07-21: qty 40

## 2016-07-21 MED ORDER — HYDROMORPHONE HCL 1 MG/ML IJ SOLN
0.5000 mg | Freq: Once | INTRAMUSCULAR | Status: AC
Start: 2016-07-21 — End: 2016-07-21
  Administered 2016-07-21: 0.5 mg via INTRAVENOUS
  Filled 2016-07-21: qty 0.5

## 2016-07-21 MED ORDER — ONDANSETRON HCL 4 MG/2ML IJ SOLN
4.0000 mg | Freq: Once | INTRAMUSCULAR | Status: AC
  Administered 2016-07-21: 4 mg via INTRAVENOUS
  Filled 2016-07-21: qty 2

## 2016-07-21 NOTE — Discharge Instructions (Signed)
Follow-up with the GI doctor if not improving

## 2016-07-21 NOTE — ED Notes (Signed)
Patient verbalizes understanding of discharge instructions, prescriptions, home care and follow up care. Patient out of department at this time with family. 

## 2016-07-21 NOTE — ED Provider Notes (Signed)
[facility]-EMERGENCY DEPT Provider Note   CSN: [id number] Arrival date & time: 07/21/16  1843     History   Chief Complaint No chief complaint on file.   HPI Suzanne Holt is a 26 y.o. female.  Patient states that she could not get the nausea medicine yesterday and it was too expensive. She did get the Prilosec. She was diagnosed with gastritis last night and has continued vomiting. Patient also complains of pain epigastric   The history is provided by the patient. No language interpreter was used.  Emesis   The current episode started yesterday. The problem occurs 2 to 4 times per day. The problem has not changed since onset.The emesis has an appearance of stomach contents. There has been no fever. Associated symptoms include abdominal pain. Pertinent negatives include no chills, no cough, no diarrhea and no headaches.    Past Medical History:  Diagnosis Date  . Anemia   . Bipolar 1 disorder (HCC)   . Headache     There are no active problems to display for this patient.   Past Surgical History:  Procedure Laterality Date  . BARTHOLIN CYST MARSUPIALIZATION N/A 07/17/2013   Procedure: BARTHOLIN CYST MARSUPIALIZATION;  Surgeon: Essie Hart, MD;  Location: [facility];  Service: Gynecology;  Laterality: N/A;  . DILATION AND CURETTAGE OF UTERUS  07/17/2013   Procedure: Bartholin Cyst culture;  Surgeon: Essie Hart, MD;  Location: [facility];  Service: Gynecology;;  . FLEXOR TENDON REPAIR Left 03/29/2014   Procedure: LEFT SMALL FINGER FLEXOR DIGITORIUM PROSUNDUS/FLEXOR DIGITORIUM SUPERFICIALIS AS NECESSARY TENDON FLEXOR REPAIR;  Surgeon: Dominica Severin, MD;  Location: [facility];  Service: Orthopedics;  Laterality: Left;  . WISDOM TOOTH EXTRACTION      OB History    Gravida Para Term Preterm AB Living   0 0 0 0 0 0   SAB TAB Ectopic Multiple Live Births   0 0 0 0         Home Medications    Prior to Admission medications   Medication Sig Start Date End Date Taking? Authorizing Provider    ibuprofen (ADVIL,MOTRIN) 200 MG tablet Take 200-400 mg by mouth every 6 (six) hours as needed for headache, mild pain or moderate pain.   Yes [provider]  omeprazole (PRILOSEC) 20 MG capsule Take 1 capsule (20 mg total) by mouth daily. 07/20/16  Yes Horton, Mayer Masker, MD  ondansetron (ZOFRAN ODT) 4 MG disintegrating tablet Take 1 tablet (4 mg total) by mouth every 8 (eight) hours as needed for nausea or vomiting. Patient not taking: Reported on 07/21/2016 07/20/16   Horton, Mayer Masker, MD  oxyCODONE-acetaminophen (PERCOCET/ROXICET) 5-325 MG tablet Take 2 tablets by mouth every 4 (four) hours as needed for severe pain. Patient not taking: Reported on 07/20/2016 08/05/15   Jean Rosenthal, NP  promethazine (PHENERGAN) 25 MG tablet Take 1 tablet (25 mg total) by mouth every 6 (six) hours as needed for nausea or vomiting. 07/21/16   Bethann Berkshire, MD  traMADol (ULTRAM) 50 MG tablet Take 1 tablet (50 mg total) by mouth every 6 (six) hours as needed. 07/21/16   Bethann Berkshire, MD    Family History Family History  Problem Relation Age of Onset  . Heart disease Mother   . Hypertension Father   . Diabetes Other     Social History Social History  Substance Use Topics  . Smoking status: Current Some Day Smoker    Packs/day: 0.25    Types: Cigars  . Smokeless tobacco:  Never Used  . Alcohol use Yes     Comment: OCC     Allergies   Patient has no known allergies.   Review of Systems Review of Systems  Constitutional: Negative for appetite change, chills and fatigue.  HENT: Negative for congestion, ear discharge and sinus pressure.   Eyes: Negative for discharge.  Respiratory: Negative for cough.   Cardiovascular: Negative for chest pain.  Gastrointestinal: Positive for abdominal pain and vomiting. Negative for diarrhea.  Genitourinary: Negative for frequency and hematuria.  Musculoskeletal: Negative for back pain.  Skin: Negative for rash.  Neurological: Negative for seizures  and headaches.  Psychiatric/Behavioral: Negative for hallucinations.     Physical Exam Updated Vital Signs BP 123/82 (BP Location: Right Arm)   Pulse (!) 58   Temp 98.4 F (36.9 C) (Oral)   Resp 16   Ht 5\' 4"  (1.626 m)   Wt 122 lb (55.3 kg)   LMP 06/27/2016   SpO2 100%   BMI 20.94 kg/m   Physical Exam  Constitutional: She is oriented to person, place, and time. She appears well-developed.  HENT:  Head: Normocephalic.  Eyes: Conjunctivae and EOM are normal. No scleral icterus.  Neck: Neck supple. No thyromegaly present.  Cardiovascular: Normal rate and regular rhythm.  Exam reveals no gallop and no friction rub.   No murmur heard. Pulmonary/Chest: No stridor. She has no wheezes. She has no rales. She exhibits no tenderness.  Abdominal: She exhibits no distension. There is tenderness. There is no rebound.  Musculoskeletal: Normal range of motion. She exhibits no edema.  Lymphadenopathy:    She has no cervical adenopathy.  Neurological: She is oriented to person, place, and time. She exhibits normal muscle tone. Coordination normal.  Skin: No rash noted. No erythema.  Psychiatric: She has a normal mood and affect. Her behavior is normal.     ED Treatments / Results  Labs (all labs ordered are listed, but only abnormal results are displayed) Labs Reviewed - No data to display  EKG  EKG Interpretation None       Radiology Dg Chest 2 View  Result Date: 07/20/2016 CLINICAL DATA:  Acute onset of generalized chest pain and shortness of breath. Abdominal cramping. Initial encounter. EXAM: CHEST  2 VIEW COMPARISON:  None. FINDINGS: The lungs are well-aerated and clear. There is no evidence of focal opacification, pleural effusion or pneumothorax. The heart is normal in size; the mediastinal contour is within normal limits. No acute osseous abnormalities are seen. IMPRESSION: No acute cardiopulmonary process seen. Electronically Signed   By: Roanna Raider M.D.   On:  07/20/2016 02:45   Ct Abdomen Pelvis W Contrast  Result Date: 07/20/2016 CLINICAL DATA:  Abdominal pain, cramping, and vomiting for 2 days. Leukocytosis. EXAM: CT ABDOMEN AND PELVIS WITH CONTRAST TECHNIQUE: Multidetector CT imaging of the abdomen and pelvis was performed using the standard protocol following bolus administration of intravenous contrast. CONTRAST:  80mL ISOVUE-300 IOPAMIDOL (ISOVUE-300) INJECTION 61% COMPARISON:  Ultrasound right upper quadrant 07/20/2016 FINDINGS: Lower chest: Lung bases are clear. Hepatobiliary: No focal liver abnormality is seen. No gallstones, gallbladder wall thickening, or biliary dilatation. Pancreas: Unremarkable. No pancreatic ductal dilatation or surrounding inflammatory changes. Spleen: Normal in size without focal abnormality. Adrenals/Urinary Tract: Adrenal glands are unremarkable. Kidneys are normal, without renal calculi, focal lesion, or hydronephrosis. Bladder is unremarkable. Stomach/Bowel: Mild wall thickening suggested in the pyloric region of the stomach possibly indicating gastritis. Small bowel are decompressed. Diffusely stool-filled colon without abnormal distention or wall thickening.  Appendix is normal. Vascular/Lymphatic: No significant vascular findings are present. No enlarged abdominal or pelvic lymph nodes. Reproductive: Uterus and bilateral adnexa are unremarkable. Other: No abdominal wall hernia or abnormality. No abdominopelvic ascites. Musculoskeletal: No acute or significant osseous findings. IMPRESSION: Suggestion of wall thickening in the pyloric region of the stomach. This may indicate gastritis. No evidence of bowel obstruction or inflammation. No acute process otherwise identified in the abdomen or pelvis. Electronically Signed   By: Burman NievesWilliam  Stevens M.D.   On: 07/20/2016 06:25   [place] Abdomen Limited Ruq  Result Date: 07/20/2016 CLINICAL DATA:  26 y/o  F; 3 days of abdominal pain with vomiting. EXAM: US ABDOMEN LIMITED - RIGHT UPPER  QUADRANT COMPARISON:  None. FINDINGS: Gallbladder: No gallstones or wall thickening visualized. No sonographic Murphy sign noted by sonographer. Common bile duct: Diameter: 1.8 mm Liver: No focal lesion identified. Within normal limits in parenchymal echogenicity. IMPRESSION: Normal right upper quadrant ultrasound. Electronically Signed   By: Mitzi HansenLance  Furusawa-Stratton M.D.   On: 07/20/2016 04:07    Procedures Procedures (including critical care time)  Medications Ordered in ED Medications  sodium chloride 0.9 % bolus 1,000 mL (0 mLs Intravenous Stopped 07/21/16 2228)  HYDROmorphone (DILAUDID) injection 0.5 mg (0.5 mg Intravenous Given 07/21/16 2127)  ondansetron (ZOFRAN) injection 4 mg (4 mg Intravenous Given 07/21/16 2127)  pantoprazole (PROTONIX) injection 40 mg (40 mg Intravenous Given 07/21/16 2127)     Initial Impression / Assessment and Plan / ED Course  I have reviewed the triage vital signs and the nursing notes.  Pertinent labs & imaging results that were available during my care of the patient were reviewed by me and considered in my medical decision making (see chart for details).     Patient symptoms improved with Zofran and Dilaudid. She will be discharged home with Phenergan pills and Ultram for pain. Both of these she should be a little filled because they're inexpensive. Patient also was referred to GI  Final Clinical Impressions(s) / ED Diagnoses   Final diagnoses:  Acute superficial gastritis without hemorrhage    New Prescriptions New Prescriptions   PROMETHAZINE (PHENERGAN) 25 MG TABLET    Take 1 tablet (25 mg total) by mouth every 6 (six) hours as needed for nausea or vomiting.   TRAMADOL (ULTRAM) 50 MG TABLET    Take 1 tablet (50 mg total) by mouth every 6 (six) hours as needed.     Bethann BerkshireZammit, Emmalina Espericueta, MD 07/21/16 2236

## 2016-07-21 NOTE — ED Triage Notes (Signed)
Pt comes from home via EMS with complaints of emesis, abdominal pain, and anxiety.  Pt states it has been going on for the past few weeks.  [facility] and was diagnosed with gastritis. A&O x4. Vitals WNL. 20 G L AC. Zofran given in route.

## 2018-04-15 IMAGING — DX DG CHEST 2V
2 series · 2 of 2 positions shown · non-contrast
Comparison: None.

CLINICAL DATA: Acute onset of generalized chest pain and shortness
of breath. Abdominal cramping. Initial encounter.

EXAM:
CHEST  2 VIEW

[chest pa]
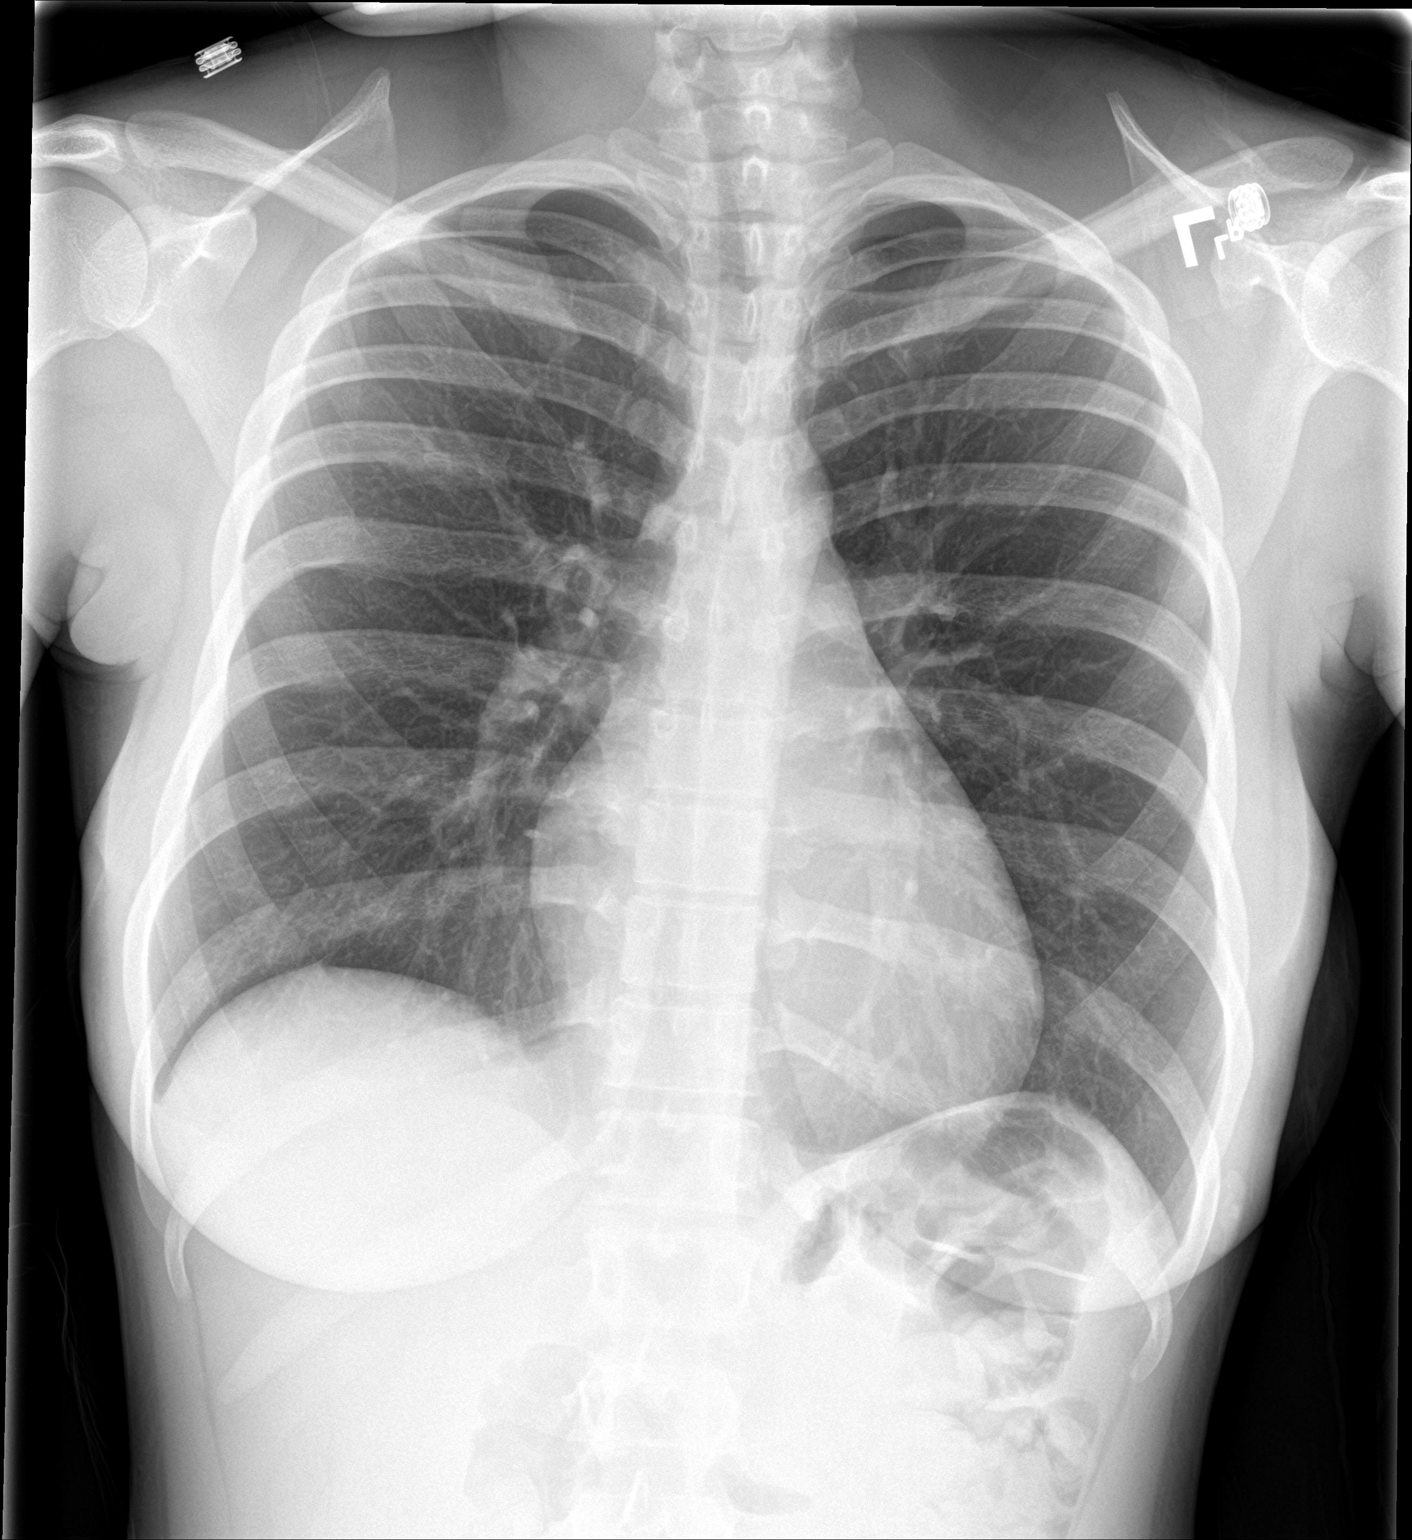

[chest lat]
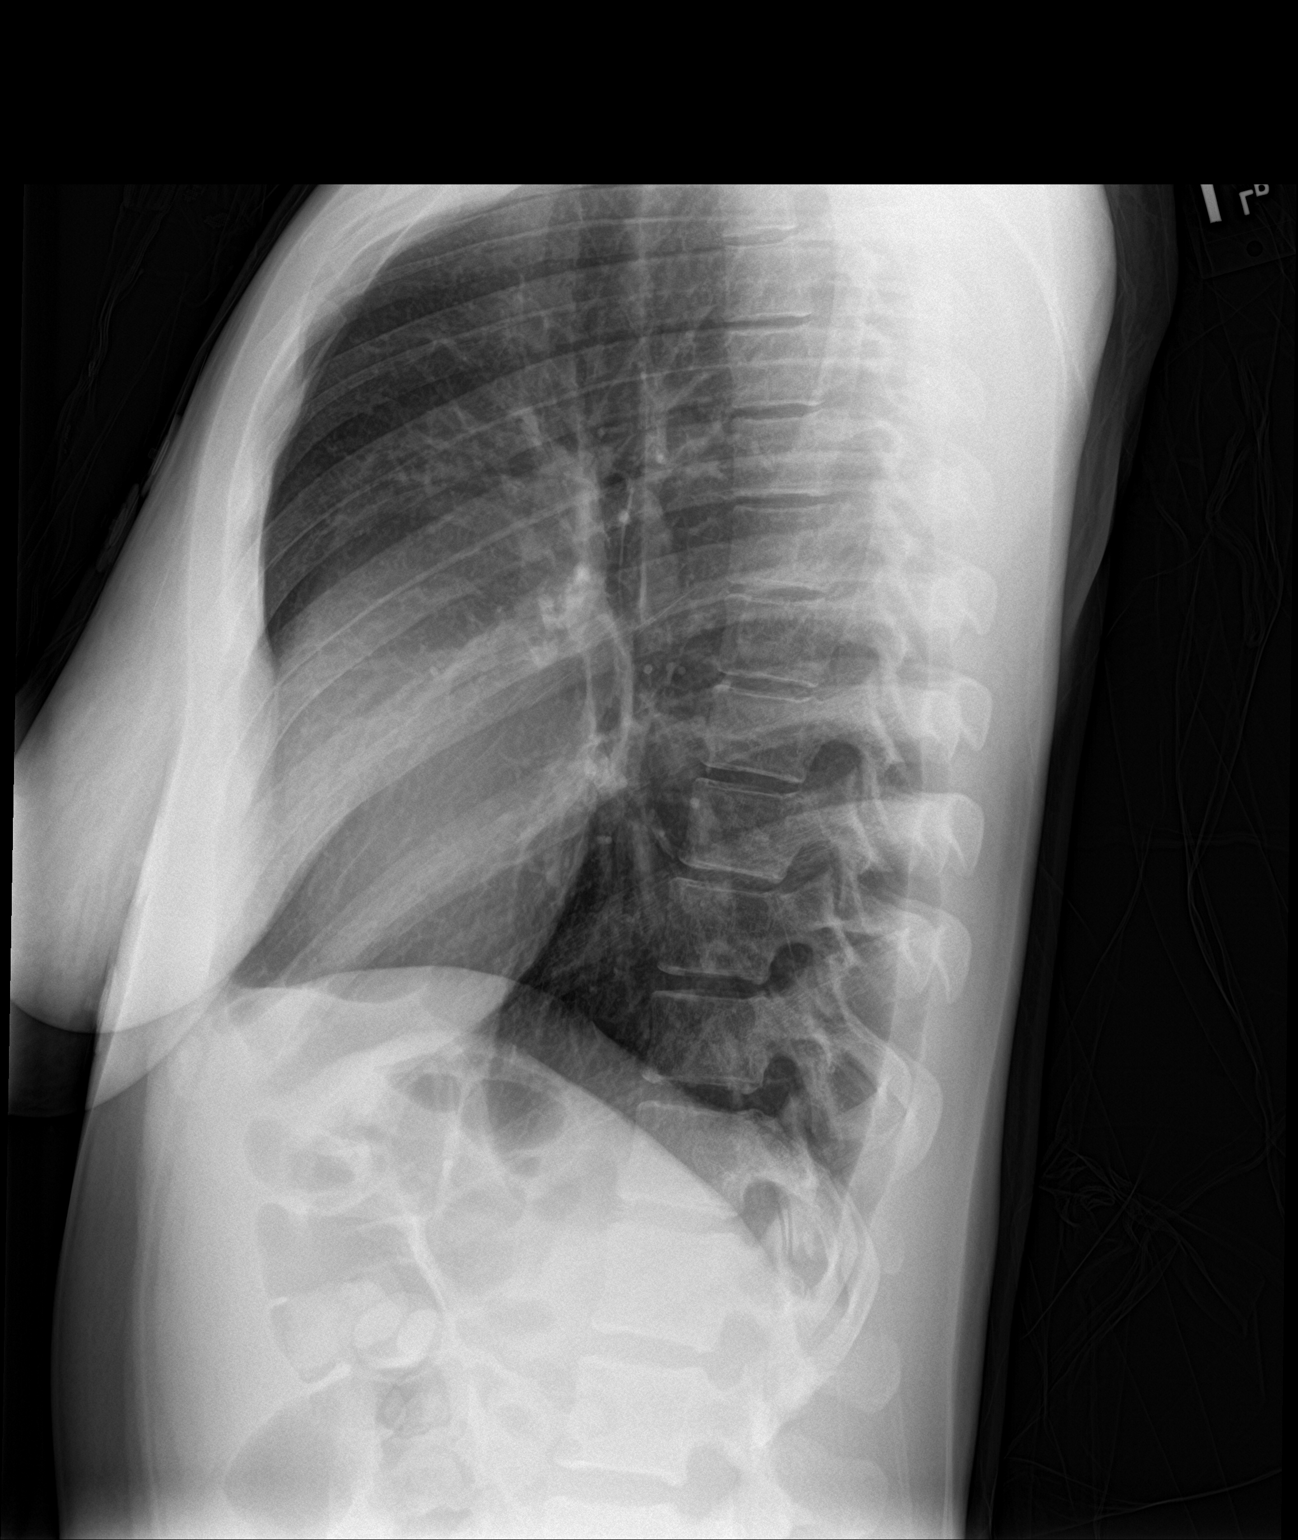

[2 of 2 positions shown; findings below may reference images not displayed]

FINDINGS: The lungs are well-aerated and clear. There is no evidence of focal
opacification, pleural effusion or pneumothorax.

The heart is normal in size; the mediastinal contour is within
normal limits. No acute osseous abnormalities are seen.
IMPRESSION: No acute cardiopulmonary process seen.

## 2020-08-17 ENCOUNTER — Encounter (HOSPITAL_COMMUNITY): Payer: Self-pay

## 2020-08-17 ENCOUNTER — Ambulatory Visit (HOSPITAL_COMMUNITY)
Admission: EM | Admit: 2020-08-17 | Discharge: 2020-08-17 | Disposition: A | Discharge status: Home / Self Care | Payer: Self-pay | Attending: Internal Medicine | Admitting: Internal Medicine

## 2020-08-17 DIAGNOSIS — B349 Viral infection, unspecified: Secondary | ICD-10-CM

## 2020-08-17 DIAGNOSIS — U071 COVID-19: Secondary | ICD-10-CM | POA: Insufficient documentation

## 2020-08-17 DIAGNOSIS — Z28310 Unvaccinated for covid-19: Secondary | ICD-10-CM | POA: Insufficient documentation

## 2020-08-17 DIAGNOSIS — F17290 Nicotine dependence, other tobacco product, uncomplicated: Secondary | ICD-10-CM | POA: Insufficient documentation

## 2020-08-17 DIAGNOSIS — F1290 Cannabis use, unspecified, uncomplicated: Secondary | ICD-10-CM | POA: Insufficient documentation

## 2020-08-17 LAB — CK: Total CK: 84 U/L (ref 38–234)

## 2020-08-17 MED ORDER — KETOROLAC TROMETHAMINE 30 MG/ML IJ SOLN
INTRAMUSCULAR | Status: AC
  Filled 2020-08-17: qty 1

## 2020-08-17 MED ORDER — IBUPROFEN 600 MG PO TABS: 600.0000 mg | ORAL_TABLET | Freq: Four times a day (QID) | ORAL | 0 refills | Status: DC | PRN

## 2020-08-17 MED ORDER — KETOROLAC TROMETHAMINE 30 MG/ML IJ SOLN
30.0000 mg | Freq: Once | INTRAMUSCULAR | Status: AC
  Administered 2020-08-17: 30 mg via INTRAMUSCULAR

## 2020-08-17 NOTE — ED Triage Notes (Signed)
Pt in with c/o body aches and loss of appetite that started on Friday  Pt states she was told years ago that she had restless leg syndrome

## 2020-08-17 NOTE — Discharge Instructions (Signed)
Increase oral fluid intake Tylenol/Motrin as needed for pain We will call you with recommendations if labs are abnormal. Return to urgent care if symptoms worsen.

## 2020-08-18 ENCOUNTER — Telehealth (HOSPITAL_COMMUNITY): Payer: Self-pay | Admitting: Emergency Medicine

## 2020-08-18 LAB — SARS CORONAVIRUS 2 (TAT 6-24 HRS): SARS Coronavirus 2: POSITIVE — AB

## 2020-08-18 NOTE — Telephone Encounter (Signed)
Patient requested work note.  Seen 08/16/2020.  covid test was positive.  This nurse requested fowler, np review medical record and consider work note.  Note provided to patient in lobby

## 2020-08-18 NOTE — ED Provider Notes (Signed)
[facility]    CSN: [id number] Arrival date & time: 08/17/20  1027      History   Chief Complaint Chief Complaint  Patient presents with  . Generalized Body Aches    HPI Suzanne Holt is a 30 y.o. female comes to the urgent care with generalized body aches and loss of appetite of 3 days duration.  Patient's symptoms started insidiously and has been getting progressively worse.  He has had a low-grade fever of 500.5 Fahrenheit.  No nausea, vomiting or diarrhea.  Patient is not vaccinated against COVID-19 virus.  No sick contacts.   HPI  Past Medical History:  Diagnosis Date  . Anemia   . Bipolar 1 disorder (HCC)   . Headache     There are no problems to display for this patient.   Past Surgical History:  Procedure Laterality Date  . BARTHOLIN CYST MARSUPIALIZATION N/A 07/17/2013   Procedure: BARTHOLIN CYST MARSUPIALIZATION;  Surgeon: Essie Hart, MD;  Location: [facility];  Service: Gynecology;  Laterality: N/A;  . DILATION AND CURETTAGE OF UTERUS  07/17/2013   Procedure: Bartholin Cyst culture;  Surgeon: Essie Hart, MD;  Location: [facility];  Service: Gynecology;;  . FLEXOR TENDON REPAIR Left 03/29/2014   Procedure: LEFT SMALL FINGER FLEXOR DIGITORIUM PROSUNDUS/FLEXOR DIGITORIUM SUPERFICIALIS AS NECESSARY TENDON FLEXOR REPAIR;  Surgeon: Dominica Severin, MD;  Location: [facility];  Service: Orthopedics;  Laterality: Left;  . WISDOM TOOTH EXTRACTION      OB History    Gravida  0   Para  0   Term  0   Preterm  0   AB  0   Living  0     SAB  0   IAB  0   Ectopic  0   Multiple  0   Live Births               Home Medications    Prior to Admission medications   Medication Sig Start Date End Date Taking? Authorizing Provider  ibuprofen (ADVIL) 600 MG tablet Take 1 tablet (600 mg total) by mouth every 6 (six) hours as needed. 08/17/20  Yes Makayleigh Poliquin, Britta Mccreedy, MD  omeprazole (PRILOSEC) 20 MG capsule Take 1 capsule (20 mg total) by mouth daily. 07/20/16    Horton, Mayer Masker, MD  promethazine (PHENERGAN) 25 MG tablet Take 1 tablet (25 mg total) by mouth every 6 (six) hours as needed for nausea or vomiting. 07/21/16   Bethann Berkshire, MD  traMADol (ULTRAM) 50 MG tablet Take 1 tablet (50 mg total) by mouth every 6 (six) hours as needed. 07/21/16   Bethann Berkshire, MD    Family History Family History  Problem Relation Age of Onset  . Heart disease Mother   . Hypertension Father   . Diabetes Other     Social History Social History   Tobacco Use  . Smoking status: Current Some Day Smoker    Packs/day: 0.25    Types: Cigars  . Smokeless tobacco: Never Used  Substance Use Topics  . Alcohol use: Yes    Comment: OCC  . Drug use: Yes    Types: Marijuana     Allergies   Patient has no known allergies.   Review of Systems Review of Systems  Constitutional: Positive for appetite change and fatigue. Negative for fever.  HENT: Negative.   Gastrointestinal: Negative.   Musculoskeletal: Positive for arthralgias and myalgias.  Skin: Negative.      Physical Exam Triage Vital Signs ED Triage  Vitals  Enc Vitals Group     BP 08/17/20 1133 129/83     Pulse Rate 08/17/20 1133 99     Resp 08/17/20 1133 19     Temp 08/17/20 1133 (!) 100.5 F (38.1 C)     Temp Source 08/17/20 1133 Oral     SpO2 08/17/20 1133 100 %     Weight --      Height --      Head Circumference --      Peak Flow --      Pain Score 08/17/20 1132 9     Pain Loc --      Pain Edu? --      Excl. in GC? --    No data found.  Updated Vital Signs BP 129/83 (BP Location: Right Arm)   Pulse 99   Temp (!) 100.5 F (38.1 C) (Oral)   Resp 19   SpO2 100%   Visual Acuity Right Eye Distance:   Left Eye Distance:   Bilateral Distance:    Right Eye Near:   Left Eye Near:    Bilateral Near:     Physical Exam Vitals and nursing note reviewed.  Cardiovascular:     Rate and Rhythm: Normal rate and regular rhythm.     Pulses: Normal pulses.     Heart sounds:  Normal heart sounds.  Pulmonary:     Effort: Pulmonary effort is normal.     Breath sounds: Normal breath sounds.  Neurological:     Mental Status: She is alert.      UC Treatments / Results  Labs (all labs ordered are listed, but only abnormal results are displayed) Labs Reviewed  SARS CORONAVIRUS 2 (TAT 6-24 HRS) - Abnormal; Notable for the following components:      Result Value   SARS Coronavirus 2 POSITIVE (*)    All other components within normal limits  CK    EKG   Radiology No results found.  Procedures Procedures (including critical care time)  Medications Ordered in UC Medications  ketorolac (TORADOL) 30 MG/ML injection 30 mg (30 mg Intramuscular Given 08/17/20 1208)    Initial Impression / Assessment and Plan / UC Course  I have reviewed the triage vital signs and the nursing notes.  Pertinent labs & imaging results that were available during my care of the patient were reviewed by me and considered in my medical decision making (see chart for details).     1.  Acute viral illness: COVID-19 PCR has been sent Increase oral fluid intake Tylenol/Motrin as needed for pain and/or fever Return to urgent care if symptoms worsen. Final Clinical Impressions(s) / UC Diagnoses   Final diagnoses:  Viral illness     Discharge Instructions     Increase oral fluid intake Tylenol/Motrin as needed for pain We will call you with recommendations if labs are abnormal. Return to urgent care if symptoms worsen.   ED Prescriptions    Medication Sig Dispense Auth. Provider   ibuprofen (ADVIL) 600 MG tablet Take 1 tablet (600 mg total) by mouth every 6 (six) hours as needed. 30 tablet Goldie Tregoning, Britta Mccreedy, MD     PDMP not reviewed this encounter.   Merrilee Jansky, MD 08/18/20 1409

## 2020-11-22 ENCOUNTER — Encounter (HOSPITAL_COMMUNITY): Payer: Self-pay | Admitting: Emergency Medicine

## 2020-11-22 ENCOUNTER — Encounter (HOSPITAL_COMMUNITY): Payer: Self-pay

## 2020-11-22 ENCOUNTER — Emergency Department (HOSPITAL_COMMUNITY)
Admission: EM | Admit: 2020-11-22 | Discharge: 2020-11-23 | Disposition: A | Discharge status: Home / Self Care | Payer: Self-pay | Attending: Emergency Medicine | Admitting: Emergency Medicine

## 2020-11-22 ENCOUNTER — Ambulatory Visit (HOSPITAL_COMMUNITY)
Admission: EM | Admit: 2020-11-22 | Discharge: 2020-11-22 | Disposition: A | Discharge status: Home / Self Care | Payer: Self-pay

## 2020-11-22 ENCOUNTER — Other Ambulatory Visit: Payer: Self-pay

## 2020-11-22 DIAGNOSIS — Z5321 Procedure and treatment not carried out due to patient leaving prior to being seen by health care provider: Secondary | ICD-10-CM | POA: Insufficient documentation

## 2020-11-22 DIAGNOSIS — N750 Cyst of Bartholin's gland: Secondary | ICD-10-CM | POA: Insufficient documentation

## 2020-11-22 NOTE — ED Triage Notes (Signed)
Pt c/o Bartholin's cyst x 1 week. Hx of the same.

## 2020-11-22 NOTE — ED Provider Notes (Signed)
Patient presents with an acutely enlarged Bartholin cyst, states she has had this in the past and it was opened, drained with a stent placed finally resolved.  Patient was advised that we will not be able to do that in the urgent care setting.  Patient was advised to go to the emergency room.  Patient states that this is painful but she appears to be in no disc acute distress at this time.  Vital signs are stable.   Theadora Rama Scales, PA-C 11/22/20 1850

## 2020-11-22 NOTE — ED Triage Notes (Signed)
Pt presents with concerns of a cyst X 1 week. States the cyst started swelling.

## 2020-11-23 ENCOUNTER — Emergency Department (HOSPITAL_BASED_OUTPATIENT_CLINIC_OR_DEPARTMENT_OTHER)
Admission: EM | Admit: 2020-11-23 | Discharge: 2020-11-23 | Disposition: A | Discharge status: Home / Self Care | Payer: Self-pay | Attending: Emergency Medicine | Admitting: Emergency Medicine

## 2020-11-23 ENCOUNTER — Other Ambulatory Visit: Payer: Self-pay

## 2020-11-23 ENCOUNTER — Encounter (HOSPITAL_BASED_OUTPATIENT_CLINIC_OR_DEPARTMENT_OTHER): Payer: Self-pay | Admitting: *Deleted

## 2020-11-23 ENCOUNTER — Encounter (HOSPITAL_BASED_OUTPATIENT_CLINIC_OR_DEPARTMENT_OTHER): Payer: Self-pay

## 2020-11-23 DIAGNOSIS — F17210 Nicotine dependence, cigarettes, uncomplicated: Secondary | ICD-10-CM | POA: Insufficient documentation

## 2020-11-23 DIAGNOSIS — N750 Cyst of Bartholin's gland: Secondary | ICD-10-CM | POA: Insufficient documentation

## 2020-11-23 DIAGNOSIS — N751 Abscess of Bartholin's gland: Secondary | ICD-10-CM | POA: Insufficient documentation

## 2020-11-23 MED ORDER — LIDOCAINE-EPINEPHRINE (PF) 2 %-1:200000 IJ SOLN
20.0000 mL | Freq: Once | INTRAMUSCULAR | Status: AC
  Administered 2020-11-23: 20 mL
  Filled 2020-11-23: qty 20

## 2020-11-23 MED ORDER — HYDROCODONE-ACETAMINOPHEN 5-325 MG PO TABS: 1.0000 | ORAL_TABLET | Freq: Four times a day (QID) | ORAL | 0 refills | Status: DC | PRN

## 2020-11-23 MED ORDER — IBUPROFEN 600 MG PO TABS: 600.0000 mg | ORAL_TABLET | Freq: Four times a day (QID) | ORAL | 0 refills | Status: DC | PRN

## 2020-11-23 NOTE — Discharge Instructions (Addendum)
You may alternate taking Tylenol and Ibuprofen as needed for pain control. You may take 400-600 mg of ibuprofen every 6 hours and [phone] mg of Tylenol every 6 hours. Do not exceed 4000 mg of Tylenol daily as this can lead to liver damage. Also, make sure to take Ibuprofen with meals as it can cause an upset stomach. Do not take other NSAIDs while taking Ibuprofen such as (Aleve, Naprosyn, Aspirin, Celebrex, etc) and do not take more than the prescribed dose as this can lead to ulcers and bleeding in your GI tract. You may use warm and cold compresses to help with your symptoms.   Prescription given for Norco. Take medication as directed and do not operate machinery, drive a car, or work while taking this medication as it can make you drowsy.   Please follow up with your primary doctor within the next 7-10 days for re-evaluation and further treatment of your symptoms.   Please return to the ER sooner if you have any new or worsening symptoms.

## 2020-11-23 NOTE — Discharge Instructions (Addendum)
You were seen today and had a Word catheter placed in a Bartholin's cyst.  Keep the catheter in place for 2 to 4 weeks.  Follow-up with OB/GYN.

## 2020-11-23 NOTE — ED Notes (Addendum)
MD with pt to drain bartholin cyst. Pt tolerated well.

## 2020-11-23 NOTE — ED Notes (Signed)
Pt verbalized understanding of dc instructions.

## 2020-11-23 NOTE — ED Triage Notes (Signed)
Pt arrives POV, had bartholin cyst drained last night here at [facility].  Returns today d/t pain in left vaginal area.

## 2020-11-23 NOTE — ED Notes (Signed)
At bedside with PA for assessment of cyst.

## 2020-11-23 NOTE — ED Provider Notes (Signed)
[facility] EMERGENCY DEPT Provider Note   CSN: [id number] Arrival date & time: 11/23/20  0120     History Chief Complaint  Patient presents with   Other    ? Cyst     Suzanne Holt is a 30 y.o. female.  HPI     This is a 30 year old female with history of anemia and bipolar disorder who presents with concerns for a Bartholin cyst.  She has a history of the same.  Patient has noted increased swelling of the left labia for approximately 2 weeks.  She has had increasing pain over the last 2 to 3 days.  No spontaneous drainage noted.  Has not had any fevers or systemic symptoms.  Notes increasing pain with sitting position.  She drives for [employer].  Past Medical History:  Diagnosis Date   Anemia    Bipolar 1 disorder (HCC)    Headache     There are no problems to display for this patient.   Past Surgical History:  Procedure Laterality Date   BARTHOLIN CYST MARSUPIALIZATION N/A 07/17/2013   Procedure: BARTHOLIN CYST MARSUPIALIZATION;  Surgeon: Essie Hart, MD;  Location: [facility];  Service: Gynecology;  Laterality: N/A;   DILATION AND CURETTAGE OF UTERUS  07/17/2013   Procedure: Bartholin Cyst culture;  Surgeon: Essie Hart, MD;  Location: [facility];  Service: Gynecology;;   FLEXOR TENDON REPAIR Left 03/29/2014   Procedure: LEFT SMALL FINGER FLEXOR DIGITORIUM PROSUNDUS/FLEXOR DIGITORIUM SUPERFICIALIS AS NECESSARY TENDON FLEXOR REPAIR;  Surgeon: Dominica Severin, MD;  Location: [facility];  Service: Orthopedics;  Laterality: Left;   WISDOM TOOTH EXTRACTION       OB History     Gravida  0   Para  0   Term  0   Preterm  0   AB  0   Living  0      SAB  0   IAB  0   Ectopic  0   Multiple  0   Live Births              Family History  Problem Relation Age of Onset   Heart disease Mother    Hypertension Father    Diabetes Other     Social History   Tobacco Use   Smoking status: Some Days    Packs/day: 0.25    Types: Cigars, Cigarettes    Smokeless tobacco: Never  Substance Use Topics   Alcohol use: Yes    Comment: OCC   Drug use: Yes    Types: Marijuana    Home Medications Prior to Admission medications   Not on File    Allergies    Patient has no known allergies.  Review of Systems   Review of Systems  Constitutional:  Negative for fever.  Genitourinary:  Positive for vaginal pain. Negative for vaginal bleeding and vaginal discharge.  Skin:        Abscess  All other systems reviewed and are negative.  Physical Exam Updated Vital Signs BP 132/71   Pulse 66   Temp 98.3 F (36.8 C) (Oral)   Resp 16   Ht 1.626 m (5\' 4" )   Wt 56.2 kg   LMP 08/12/2020 (Approximate)   SpO2 100%   BMI 21.28 kg/m   Physical Exam Vitals and nursing note reviewed.  Constitutional:      Appearance: She is well-developed. She is not ill-appearing.  HENT:     Head: Normocephalic and atraumatic.     Mouth/Throat:  Mouth: Mucous membranes are moist.  Cardiovascular:     Rate and Rhythm: Normal rate and regular rhythm.  Pulmonary:     Effort: Pulmonary effort is normal. No respiratory distress.  Abdominal:     Palpations: Abdomen is soft.  Genitourinary:    Comments: Large cystic-like swelling to the left labia approximately 8 x 4 cm, highly mobile, no overlying skin changes or erythema Musculoskeletal:     Cervical back: Neck supple.  Skin:    General: Skin is warm and dry.  Neurological:     Mental Status: She is alert and oriented to person, place, and time.  Psychiatric:        Mood and Affect: Mood normal.    ED Results / Procedures / Treatments   Labs (all labs ordered are listed, but only abnormal results are displayed) Labs Reviewed - No data to display  EKG None  Radiology No results found.  Procedures .Marland KitchenIncision and Drainage  Date/Time: 11/23/2020 2:10 AM Performed by: Shon Baton, MD Authorized by: Shon Baton, MD   Consent:    Consent obtained:  Verbal   Consent given by:   Patient   Risks discussed:  Bleeding and incomplete drainage   Alternatives discussed:  No treatment Universal protocol:    Patient identity confirmed:  Verbally with patient Location:    Type:  Bartholin cyst   Size:  8x4 cm   Location:  Anogenital   Anogenital location:  Bartholin's gland Pre-procedure details:    Skin preparation:  Povidone-iodine Sedation:    Sedation type:  None Anesthesia:    Anesthesia method:  Local infiltration   Local anesthetic:  Lidocaine 2% WITH epi Procedure type:    Complexity:  Simple Procedure details:    Incision types:  Single straight   Incision depth:  Dermal   Wound management:  Probed and deloculated   Drainage:  Serosanguinous   Drainage amount:  Copious   Packing materials:  Word catheter Post-procedure details:    Procedure completion:  Tolerated   Medications Ordered in ED Medications  lidocaine-EPINEPHrine (XYLOCAINE W/EPI) 2 %-1:200000 (PF) injection 20 mL (20 mLs Infiltration Given 11/23/20 0207)    ED Course  I have reviewed the triage vital signs and the nursing notes.  Pertinent labs & imaging results that were available during my care of the patient were reviewed by me and considered in my medical decision making (see chart for details).    MDM Rules/Calculators/A&P                           Patient presents with swollen painful lesion on her labia.  Physical exam is consistent with a Bartholin cyst/abscess.  This was I indeed at the bedside.  No overlying skin changes or erythema suggestive of acute infection.  Cyst fluid was actually fairly serosanguineous.  Word catheter was placed.  Patient was given GYN follow-up.  Instructions regarding Word catheter to stay in place for 2 to 4 weeks.  After history, exam, and medical workup I feel the patient has been appropriately medically screened and is safe for discharge home. Pertinent diagnoses were discussed with the patient. Patient was given return precautions.  Final  Clinical Impression(s) / ED Diagnoses Final diagnoses:  Bartholin gland cyst    Rx / DC Orders ED Discharge Orders     None        Shon Baton, MD 11/23/20 [phone]

## 2020-11-23 NOTE — ED Triage Notes (Signed)
C/o of bartholin cyst times one week. Pt has a hx of same. Pain to left side of vagina area.denies any  other complaints.

## 2020-11-23 NOTE — ED Provider Notes (Signed)
[facility] EMERGENCY DEPT Provider Note   CSN: [id number] Arrival date & time: 11/23/20  1141     History Chief Complaint  Patient presents with   Cyst    Suzanne Holt is a 30 y.o. female.  HPI  30 year old female history of anemia, bipolar disorder, headache, presents the emergency department today for evaluation of pain to the groin area.  Patient states she was seen in the ED yesterday and had a Bartholin's abscess drained.  She had a Word catheter placed at that time.  This morning she woke up and she had significant pain.  She has had no relief with over-the-counter pain medications this morning.  There have been no fevers or other complaints today.  She denies any changes to the wound, she is mainly here for pain control.  Past Medical History:  Diagnosis Date   Anemia    Bipolar 1 disorder (HCC)    Headache     There are no problems to display for this patient.   Past Surgical History:  Procedure Laterality Date   BARTHOLIN CYST MARSUPIALIZATION N/A 07/17/2013   Procedure: BARTHOLIN CYST MARSUPIALIZATION;  Surgeon: Essie Hart, MD;  Location: [facility];  Service: Gynecology;  Laterality: N/A;   DILATION AND CURETTAGE OF UTERUS  07/17/2013   Procedure: Bartholin Cyst culture;  Surgeon: Essie Hart, MD;  Location: [facility];  Service: Gynecology;;   FLEXOR TENDON REPAIR Left 03/29/2014   Procedure: LEFT SMALL FINGER FLEXOR DIGITORIUM PROSUNDUS/FLEXOR DIGITORIUM SUPERFICIALIS AS NECESSARY TENDON FLEXOR REPAIR;  Surgeon: Dominica Severin, MD;  Location: [facility];  Service: Orthopedics;  Laterality: Left;   WISDOM TOOTH EXTRACTION       OB History     Gravida  0   Para  0   Term  0   Preterm  0   AB  0   Living  0      SAB  0   IAB  0   Ectopic  0   Multiple  0   Live Births              Family History  Problem Relation Age of Onset   Heart disease Mother    Hypertension Father    Diabetes Other     Social History   Tobacco Use    Smoking status: Some Days    Packs/day: 0.25    Types: Cigars, Cigarettes   Smokeless tobacco: Never  Substance Use Topics   Alcohol use: Yes    Comment: OCC   Drug use: Yes    Types: Marijuana    Home Medications Prior to Admission medications   Medication Sig Start Date End Date Taking? Authorizing Provider  HYDROcodone-acetaminophen (NORCO/VICODIN) 5-325 MG tablet Take 1 tablet by mouth every 6 (six) hours as needed. 11/23/20  Yes Cleofas Hudgins S, PA-C  ibuprofen (ADVIL) 600 MG tablet Take 1 tablet (600 mg total) by mouth every 6 (six) hours as needed. 11/23/20  Yes Shirl Ludington S, PA-C    Allergies    Patient has no known allergies.  Review of Systems   Review of Systems  Constitutional:  Negative for fever.  Genitourinary:  Positive for vaginal pain. Negative for urgency, vaginal bleeding and vaginal discharge.   Physical Exam Updated Vital Signs BP 130/88 (BP Location: Right Arm)   Pulse 73   Temp 98.5 F (36.9 C)   Resp 16   Ht 5\' 4"  (1.626 m)   Wt 56.2 kg   SpO2 100%   BMI  21.27 kg/m   Physical Exam Vitals and nursing note reviewed.  Constitutional:      General: She is not in acute distress.    Appearance: She is well-developed.  HENT:     Head: Normocephalic and atraumatic.  Eyes:     Conjunctiva/sclera: Conjunctivae normal.  Cardiovascular:     Rate and Rhythm: Normal rate.  Pulmonary:     Effort: Pulmonary effort is normal.  Genitourinary:    Comments: Chaperone present. Swelling and ttp to the left labia with word catheter in place. Musculoskeletal:        General: Normal range of motion.     Cervical back: Neck supple.  Skin:    General: Skin is warm and dry.  Neurological:     Mental Status: She is alert.    ED Results / Procedures / Treatments   Labs (all labs ordered are listed, but only abnormal results are displayed) Labs Reviewed - No data to display  EKG None  Radiology No results found.  Procedures Procedures    Medications Ordered in ED Medications - No data to display  ED Course  I have reviewed the triage vital signs and the nursing notes.  Pertinent labs & imaging results that were available during my care of the patient were reviewed by me and considered in my medical decision making (see chart for details).    MDM Rules/Calculators/A&P                          30 year old female here for evaluation of pain to the left labia where she had an incision and drainage yesterday.  She has a Word catheter in place.  There is minimally tender.  There is no significant evidence of a surrounding cellulitis or worsening abscess at this time.  Patient is mainly here for pain control.  She is nontoxic-appearing in no acute distress.  Will give Rx for ibuprofen and Norco for home.  We will also give information for follow-up.  Have advised on return precautions.  She voices understanding of the plan and reasons to return.  All questions answered.  Patient stable for discharge.   Final Clinical Impression(s) / ED Diagnoses Final diagnoses:  Bartholin cyst    Rx / DC Orders ED Discharge Orders          Ordered    ibuprofen (ADVIL) 600 MG tablet  Every 6 hours PRN        11/23/20 1532    HYDROcodone-acetaminophen (NORCO/VICODIN) 5-325 MG tablet  Every 6 hours PRN        11/23/20 1532             Anthonymichael Munday S, PA-C 11/23/20 1533    Pollyann Savoy, MD 11/23/20 [phone]

## 2020-11-23 NOTE — ED Notes (Signed)
Pt did not not want to stay and left.

## 2020-12-03 ENCOUNTER — Telehealth (HOSPITAL_BASED_OUTPATIENT_CLINIC_OR_DEPARTMENT_OTHER): Payer: Self-pay

## 2020-12-03 NOTE — Telephone Encounter (Signed)
Patient lvm to have her called returned.  Attempted to contact her and lvm.

## 2020-12-17 ENCOUNTER — Encounter: Payer: Self-pay | Admitting: Obstetrics & Gynecology

## 2020-12-17 ENCOUNTER — Other Ambulatory Visit: Payer: Self-pay

## 2020-12-17 ENCOUNTER — Ambulatory Visit (INDEPENDENT_AMBULATORY_CARE_PROVIDER_SITE_OTHER): Payer: Self-pay | Admitting: Obstetrics & Gynecology

## 2020-12-17 VITALS — BP 111/72 | HR 100 | Ht 63.0 in | Wt 120.6 lb

## 2020-12-17 DIAGNOSIS — N751 Abscess of Bartholin's gland: Secondary | ICD-10-CM

## 2020-12-17 MED ORDER — TRAMADOL HCL 50 MG PO TABS: 50.0000 mg | ORAL_TABLET | Freq: Four times a day (QID) | ORAL | 0 refills | Status: DC | PRN

## 2020-12-17 NOTE — Progress Notes (Signed)
Patient ID: Suzanne Holt, female   DOB: 04-Jan-1991, 30 y.o.   MRN: [id number]  Chief Complaint  Patient presents with   Follow-up  &D bartholins abscess in ED 3 weeks ago HPI Suzanne Holt is a 30 y.o. female.  [id number] Patient had recurrent Bartholin's abscess with I&D and word cath placement in the ED, comes for removal of the drain. She notes irritation at the site HPI  Past Medical History:  Diagnosis Date   Anemia    Bipolar 1 disorder (HCC)    Headache     Past Surgical History:  Procedure Laterality Date   BARTHOLIN CYST MARSUPIALIZATION N/A 07/17/2013   Procedure: BARTHOLIN CYST MARSUPIALIZATION;  Surgeon: Essie Hart, MD;  Location: [facility];  Service: Gynecology;  Laterality: N/A;   DILATION AND CURETTAGE OF UTERUS  07/17/2013   Procedure: Bartholin Cyst culture;  Surgeon: Essie Hart, MD;  Location: [facility];  Service: Gynecology;;   FLEXOR TENDON REPAIR Left 03/29/2014   Procedure: LEFT SMALL FINGER FLEXOR DIGITORIUM PROSUNDUS/FLEXOR DIGITORIUM SUPERFICIALIS AS NECESSARY TENDON FLEXOR REPAIR;  Surgeon: Dominica Severin, MD;  Location: [facility];  Service: Orthopedics;  Laterality: Left;   WISDOM TOOTH EXTRACTION      Family History  Problem Relation Age of Onset   Heart disease Mother    Hypertension Father    Diabetes Other     Social History Social History   Tobacco Use   Smoking status: Some Days    Packs/day: 0.25    Types: Cigars, Cigarettes   Smokeless tobacco: Never  Substance Use Topics   Alcohol use: Yes    Comment: OCC   Drug use: Yes    Types: Marijuana    No Known Allergies  Current Outpatient Medications  Medication Sig Dispense Refill   traMADol (ULTRAM) 50 MG tablet Take 1-2 tablets (50-100 mg total) by mouth every 6 (six) hours as needed for severe pain. 12 tablet 0   HYDROcodone-acetaminophen (NORCO/VICODIN) 5-325 MG tablet Take 1 tablet by mouth every 6 (six) hours as needed. 10 tablet 0   ibuprofen (ADVIL) 600 MG tablet Take 1 tablet (600 mg  total) by mouth every 6 (six) hours as needed. 30 tablet 0   No current facility-administered medications for this visit.    Review of Systems Review of Systems  Constitutional: Negative.   Respiratory: Negative.    Cardiovascular: Negative.   Genitourinary:  Positive for vaginal pain (at drain site). Negative for frequency and menstrual problem.   Blood pressure 111/72, pulse 100, height 5\' 3"  (1.6 m), weight 120 lb 9.6 oz (54.7 kg), last menstrual period 12/10/2020.  Physical Exam Physical Exam Vitals and nursing note reviewed. Exam conducted with a chaperone present.  Constitutional:      Appearance: Normal appearance.  Pulmonary:     Effort: Pulmonary effort is normal.  Genitourinary:    Exam position: Lithotomy position.       Comments: Word catheter in place and skin is stretched over the  balloon and ulcerated. Balloon deflated by cutting tube with scissors and removed, leaving a 1 cm defect at the site of the skin ulceration, no redness or tenderness Neurological:     Mental Status: She is alert.    Data Reviewed ED note  Assessment Drain remove after vulvar abscess I&D with skin defect noted due to the drain and skin irritation and ulceration  Plan The defect will heal over time and will serve to decrease the risk of recurrence    12/12/2020 12/17/2020, 4:07 PM

## 2020-12-17 NOTE — Progress Notes (Signed)
Had Bartholin cyst drained and word catheter placed on 9/12 at [facility]>

## 2021-01-05 ENCOUNTER — Encounter (HOSPITAL_BASED_OUTPATIENT_CLINIC_OR_DEPARTMENT_OTHER): Payer: Self-pay | Admitting: Nurse Practitioner

## 2021-01-05 ENCOUNTER — Ambulatory Visit (HOSPITAL_BASED_OUTPATIENT_CLINIC_OR_DEPARTMENT_OTHER): Payer: Self-pay | Admitting: Nurse Practitioner

## 2021-01-05 ENCOUNTER — Other Ambulatory Visit: Payer: Self-pay

## 2021-01-05 ENCOUNTER — Telehealth: Payer: Self-pay | Admitting: Obstetrics & Gynecology

## 2021-01-05 VITALS — BP 104/76 | HR 84 | Ht 63.0 in | Wt 120.6 lb

## 2021-01-05 DIAGNOSIS — N750 Cyst of Bartholin's gland: Secondary | ICD-10-CM | POA: Insufficient documentation

## 2021-01-05 DIAGNOSIS — F319 Bipolar disorder, unspecified: Secondary | ICD-10-CM

## 2021-01-05 DIAGNOSIS — Z5989 Other problems related to housing and economic circumstances: Secondary | ICD-10-CM | POA: Insufficient documentation

## 2021-01-05 NOTE — Telephone Encounter (Signed)
Patient want to know if she needed  a follow from her last visit, she also have questions about getting a pap smear

## 2021-01-05 NOTE — Assessment & Plan Note (Signed)
Patient assistance information provided today to help with cost associated with uninsured patient.

## 2021-01-05 NOTE — Assessment & Plan Note (Addendum)
PHQ screening and GAD screening scores are elevated. Letter written for emotional support animal for patient today.  Referral placed for psychiatry in the setting of worsening anxiety and depression with hx of bipolar 1 d/o . No alarm symptoms present today.

## 2021-01-05 NOTE — Patient Instructions (Signed)
Recommendations from today's visit: I have sent the referral to Psychiatry- please let me know if you do not hear from them in 7 business days I have sent a referral to GYN- please let me know if you do not hear from the in 7 business days I have provided a letter- however, further evaluation and treatment is recommended for your anxiety and depression symptoms to help you best control this.  We will have you come back in the next 3 months for a physical exam  Information on diet, exercise, and health maintenance recommendations are listed below. This is information to help you be sure you are on track for optimal health and monitoring.   Please look over this and let us know if you have any questions or if you have completed any of the health maintenance outside of [facility] so that we can be sure your records are up to date.  ___________________________________________________________  Thank you for choosing [place] at [facility] for your Primary Care needs. I am excited for the opportunity to partner with you to meet your health care goals. It was a pleasure meeting you today!  I am an Adult-Geriatric Nurse Practitioner with a background in caring for patients for more than 20 years. I provide primary care and sports medicine services to patients age 28 and older within this office. I am also the director of the APP Fellowship with [facility].   I am passionate about providing the best service to you through preventive medicine and supportive care. I consider you a part of the medical team and value your input. I work diligently to ensure that you are heard and your needs are met in a safe and effective manner. I want you to feel comfortable with me as your provider and want you to know that your health concerns are important to me.  For your information, our office hours are Monday- Friday 8:00 AM - 5:00 PM At this time I am not in the office on Wednesdays.  If you  have questions or concerns, please call our office at [phone] or send [place] a MyChart message and we will respond as quickly as possible.   For all urgent or time sensitive needs we ask that you please call the office to avoid delays. MyChart is not constantly monitored and replies may take up to 72 business hours.  MyChart Policy: MyChart allows for you to see your visit notes, after visit summary, provider recommendations, lab and tests results, make an appointment, request refills, and contact your provider or the office for non-urgent questions or concerns. Providers are seeing patients during normal business hours and do not have built in time to review MyChart messages.  We ask that you allow a minimum of 4 business days for responses to [employer]. For this reason, please do not send urgent requests through [place]. Please call the office at [phone]. Complex MyChart concerns may require a visit. Your provider may request you schedule a virtual or in person visit to ensure we are providing the best care possible. MyChart messages sent after 4:00 PM on Friday will not be received by the provider until Monday morning.    Lab and Test Results: You will receive your lab and test results on MyChart as soon as they are completed and results have been sent by the lab or testing facility. Due to this service, you will receive your results BEFORE your provider.  I review lab and tests results  each morning prior to seeing patients. Some results require collaboration with other providers to ensure you are receiving the most appropriate care. For this reason, we ask that you please allow a minimum of 4 business days for your provider to receive and review lab and test results and contact you about these.  Most lab and test result comments from the provider will be sent through [place]. Your provider may recommend changes to the plan of care, follow-up visits, repeat testing, ask questions, or  request an office visit to discuss these results. You may reply directly to this message or call the office at [phone] to provide information for the provider or set up an appointment. In some instances, you will be called with test results and recommendations. Please let us know if this is preferred and we will make note of this in your chart to provide this for you.    If you have not heard a response to your lab or test results in 72 business hours, please call the office to let us know.   After Hours: For all non-emergency after hours needs, please call the office at [phone] and select the option to reach the on-call provider service. On-call services are shared between multiple [facility] offices and therefore it will not be possible to speak directly with your provider. On-call providers may provide medical advice and recommendations, but are unable to provide refills for maintenance medications.  For all emergency or urgent medical needs after normal business hours, we recommend that you seek care at the closest Urgent Care or Emergency Department to ensure appropriate treatment in a timely manner.  [facility] [place] has a 24 hour emergency room located on the ground floor for your convenience.    Please do not hesitate to reach out to [place] with concerns.   Thank you, again, for choosing me as your health care partner. I appreciate your trust and look forward to learning more about you.   Worthy Keeler, DNP, AGNP-c ___________________________________________________________  Health Maintenance Recommendations Screening Testing Mammogram Every 1 -2 years based on history and risk factors Starting at age 30 Pap Smear Ages 21-39 every 3 years Ages 52-65 every 5 years with HPV testing More frequent testing may be required based on results and history Colon Cancer Screening Every 1-10 years based on test performed, risk factors, and history Starting at age  20 Bone Density Screening Every 2-10 years based on history Starting at age 60 for women Recommendations for men differ based on medication usage, history, and risk factors AAA Screening One time ultrasound Men 38-51 years old who have every smoked Lung Cancer Screening Low Dose Lung CT every 12 months Age 74-80 years with a 30 pack-year smoking history who still smoke or who have quit within the last 15 years  Screening Labs Routine  Labs: Complete Blood Count (CBC), Complete Metabolic Panel (CMP), Cholesterol (Lipid Panel) Every 6-12 months based on history and medications May be recommended more frequently based on current conditions or previous results Hemoglobin A1c Lab Every 3-12 months based on history and previous results Starting at age 68 or earlier with diagnosis of diabetes, high cholesterol, BMI >26, and/or risk factors Frequent monitoring for patients with diabetes to ensure blood sugar control Thyroid Panel (TSH w/ T3 & T4) Every 6 months based on history, symptoms, and risk factors May be repeated more often if on medication HIV One time testing for all patients 86 and older May be repeated more frequently for  patients with increased risk factors or exposure Hepatitis C One time testing for all patients 18 and older May be repeated more frequently for patients with increased risk factors or exposure Gonorrhea, Chlamydia Every 12 months for all sexually active persons 13-24 years Additional monitoring may be recommended for those who are considered high risk or who have symptoms PSA Men 18-76 years old with risk factors Additional screening may be recommended from age 72-69 based on risk factors, symptoms, and history  Vaccine Recommendations Tetanus Booster All adults every 10 years Flu Vaccine All patients 6 months and older every year COVID Vaccine All patients 12 years and older Initial dosing with booster May recommend additional booster based on age  and health history HPV Vaccine 2 doses all patients age 32-26 Dosing may be considered for patients over 26 Shingles Vaccine (Shingrix) 2 doses all adults 71 years and older Pneumonia (Pneumovax 23) All adults 69 years and older May recommend earlier dosing based on health history Pneumonia (Prevnar 71) All adults 18 years and older Dosed 1 year after Pneumovax 23  Additional Screening, Testing, and Vaccinations may be recommended on an individualized basis based on family history, health history, risk factors, and/or exposure.  __________________________________________________________  Diet Recommendations for All Patients  I recommend that all patients maintain a diet low in saturated fats, carbohydrates, and cholesterol. While this can be challenging at first, it is not impossible and small changes can make big differences.  Things to try: Decreasing the amount of soda, sweet tea, and/or juice to one or less per day and replace with water While water is always the first choice, if you do not like water you may consider adding a water additive without sugar to improve the taste other sugar free drinks Replace potatoes with a brightly colored vegetable at dinner Use healthy oils, such as canola oil or olive oil, instead of butter or hard margarine Limit your bread intake to two pieces or less a day Replace regular pasta with low carb pasta options Bake, broil, or grill foods instead of frying Monitor portion sizes  Eat smaller, more frequent meals throughout the day instead of large meals  An important thing to remember is, if you love foods that are not great for your health, you don't have to give them up completely. Instead, allow these foods to be a reward when you have done well. Allowing yourself to still have special treats every once in a while is a nice way to tell yourself thank you for working hard to keep yourself healthy.   Also remember that every day is a new day. If  you have a bad day and "fall off the wagon", you can still climb right back up and keep moving along on your journey!  We have resources available to help you!  Some websites that may be helpful include: www.http://carter.biz/  Www.VeryWellFit.com _____________________________________________________________  Activity Recommendations for All Patients  I recommend that all adults get at least 20 minutes of moderate physical activity that elevates your heart rate at least 5 days out of the week.  Some examples include: Walking or jogging at a pace that allows you to carry on a conversation Cycling (stationary bike or outdoors) Water aerobics Yoga Weight lifting Dancing If physical limitations prevent you from putting stress on your joints, exercise in a pool or seated in a chair are excellent options.  Do determine your MAXIMUM heart rate for activity: YOUR AGE - 220 = MAX HeartRate   Remember! Do  not push yourself too hard.  Start slowly and build up your pace, speed, weight, time in exercise, etc.  Allow your body to rest between exercise and get good sleep. You will need more water than normal when you are exerting yourself. Do not wait until you are thirsty to drink. Drink with a purpose of getting in at least 8, 8 ounce glasses of water a day plus more depending on how much you exercise and sweat.    If you begin to develop dizziness, chest pain, abdominal pain, jaw pain, shortness of breath, headache, vision changes, lightheadedness, or other concerning symptoms, stop the activity and allow your body to rest. If your symptoms are severe, seek emergency evaluation immediately. If your symptoms are concerning, but not severe, please let us know so that we can recommend further evaluation.   ________________________________________________________________

## 2021-01-05 NOTE — Progress Notes (Deleted)
Tollie Eth, DNP, AGNP-c Primary Care & Sports Medicine [address]  [address] [place], [place] [address] [phone]  New patient visit   Patient: Suzanne Holt   DOB: 1990/04/18   30 y.o. Female  MRN: [id number] Visit Date: 01/05/2021  Patient Care Team: Patient, No Pcp Per (Inactive) as PCP - General (General Practice)  Today's healthcare provider: Tollie Eth, NP   Chief Complaint  Patient presents with   Establish Care    No prior PCP    Bartholian Cyst    Patient was seen in the ED for a cysts. She had it drained and stented. She states the cysts has returned and she has some labial discomfort   Letter for School/Work    Patient is requesting a letter to state her pitbull is an Chief of Staff. She denies seeing BH in over 3 years   Subjective    Suzanne Holt is a 30 y.o. female who presents today as a new patient to establish care.  HPI History of anxiety  Past Medical History:  Diagnosis Date   Anemia    Bipolar 1 disorder (HCC)    Headache    Past Surgical History:  Procedure Laterality Date   BARTHOLIN CYST MARSUPIALIZATION N/A 07/17/2013   Procedure: BARTHOLIN CYST MARSUPIALIZATION;  Surgeon: Essie Hart, MD;  Location: [facility];  Service: Gynecology;  Laterality: N/A;   DILATION AND CURETTAGE OF UTERUS  07/17/2013   Procedure: Bartholin Cyst culture;  Surgeon: Essie Hart, MD;  Location: [facility];  Service: Gynecology;;   FLEXOR TENDON REPAIR Left 03/29/2014   Procedure: LEFT SMALL FINGER FLEXOR DIGITORIUM PROSUNDUS/FLEXOR DIGITORIUM SUPERFICIALIS AS NECESSARY TENDON FLEXOR REPAIR;  Surgeon: Dominica Severin, MD;  Location: [facility];  Service: Orthopedics;  Laterality: Left;   WISDOM TOOTH EXTRACTION     Family Status  Relation Name Status   Mother  Alive   Father  Alive   Other  (Not Specified)   Family History  Problem Relation Age of Onset   Heart disease Mother    Hypertension Father    Diabetes Other    Social  History   Socioeconomic History   Marital status: Single    Spouse name: Not on file   Number of children: Not on file   Years of education: Not on file   Highest education level: Not on file  Occupational History   Not on file  Tobacco Use   Smoking status: Some Days    Packs/day: 0.25    Types: Cigars, Cigarettes   Smokeless tobacco: Never  Substance and Sexual Activity   Alcohol use: Yes    Comment: OCC   Drug use: Yes    Types: Marijuana   Sexual activity: Yes    Birth control/protection: None  Other Topics Concern   Not on file  Social History Narrative   Not on file   Social Determinants of Health   Financial Resource Strain: Not on file  Food Insecurity: Food Insecurity Present   Worried About Running Out of Food in the Last Year: Sometimes true   Ran Out of Food in the Last Year: Sometimes true  Transportation Needs: No Transportation Needs   Lack of Transportation (Medical): No   Lack of Transportation (Non-Medical): No  Physical Activity: Not on file  Stress: Not on file  Social Connections: Not on file   Outpatient Medications Prior to Visit  Medication Sig   [DISCONTINUED] HYDROcodone-acetaminophen (NORCO/VICODIN) 5-325 MG tablet Take 1 tablet by  mouth every 6 (six) hours as needed.   [DISCONTINUED] ibuprofen (ADVIL) 600 MG tablet Take 1 tablet (600 mg total) by mouth every 6 (six) hours as needed.   [DISCONTINUED] traMADol (ULTRAM) 50 MG tablet Take 1-2 tablets (50-100 mg total) by mouth every 6 (six) hours as needed for severe pain.   No facility-administered medications prior to visit.   No Known Allergies  Immunization History  Administered Date(s) Administered   Tdap 03/25/2014    Health Maintenance  Topic Date Due   COVID-19 Vaccine (1) Never done   Pneumococcal Vaccine 25-88 Years old (1 - PCV) Never done   HIV Screening  Never done   Hepatitis C Screening  Never done   PAP-Cervical Cytology Screening  Never done   PAP SMEAR-Modifier   Never done   INFLUENZA VACCINE  Never done   TETANUS/TDAP  03/25/2024   HPV VACCINES  Aged Out    Patient Care Team: Patient, No Pcp Per (Inactive) as PCP - General (General Practice)  Review of Systems All review of systems negative except what is listed in the HPI    Objective    BP 104/76   Pulse 84   Ht 5\' 3"  (1.6 m)   Wt 120 lb 9.6 oz (54.7 kg)   LMP 12/10/2020 (Exact Date)   SpO2 96%   BMI 21.36 kg/m  Physical Exam ***  Depression Screen PHQ 2/9 Scores 12/17/2020  PHQ - 2 Score 6  PHQ- 9 Score 20   No results found for any visits on 01/05/21.  Assessment & Plan      Problem List Items Addressed This Visit     Bipolar 1 disorder (HCC) - Primary    PHQ screening and GAD screening scores are elevated. Letter written for emotional support animal. Referral placed for Psychiatry to help manage anxiety and depression.      Relevant Orders   Ambulatory referral to Psychiatry   Bartholin cyst    I&D done on 12/07/20 and Stent placement and removal on 12/17/20. Still reports discomfort. Suggested a pillow to use while sitting to help alleviate pressure. Referral for GYN placed.       Relevant Orders   Ambulatory referral to Gynecology   Uninsured     Return in about 3 months (around 04/07/2021) for Sometime in next 3 months for CPE.      Sandip Power, 04/09/2021, NP, DNP, AGNP-C Primary Care & Sports Medicine at [facility]

## 2021-01-05 NOTE — Assessment & Plan Note (Addendum)
I&D done on 12/07/20 and Stent placement and removal on 12/17/20. Still reports discomfort to the area. No signs of infection reported. Suggested a pillow to use while sitting to help alleviate pressure. Referral for GYN placed for further evaluation and recommendations.

## 2021-01-05 NOTE — Progress Notes (Signed)
New Patient Office Visit  Subjective:  Patient ID: Suzanne Holt, female    DOB: 08-30-1990  Age: 30 y.o. MRN: [id number]  CC:  Chief Complaint  Patient presents with   Establish Care    No prior PCP    Bartholian Cyst    Patient was seen in the ED for a cysts. She had it drained and stented. She states the cysts has returned and she has some labial discomfort   Letter for School/Work    Patient is requesting a letter to state her pitbull is an Chief of Staff. She denies seeing BH in over 3 years    HPI Suzanne Holt presents for establishing care.   Bartholian cyst:  Has a history of recurrent Bartholian cysts. Her last one was in 2014 on her right labia and the most recent was in September, 2022 on her left labia. She went to the ED and had an I&D. Stent was also placed and was removed on 10/6. She states there is still discomfort. She was referred to gynecology and was told someone will call her but no one has reached out. 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 Denies any recent fever, chills or abnormal bleeding.   Anxiety and depression:  Reports that she has been going through a lot. Recent breakup and her housing situation has really increased her anxiety. She has a dog that she states has been very comforting. She has not been seen by a psychiatrist in over 3 years. She does report taking Seroquel to help her symptoms five years ago. Denies any suicidal or homicidal thoughts.   Past Medical History:  Diagnosis Date   Anemia    Bipolar 1 disorder (HCC)    Headache     Past Surgical History:  Procedure Laterality Date   BARTHOLIN CYST MARSUPIALIZATION N/A 07/17/2013   Procedure: BARTHOLIN CYST MARSUPIALIZATION;  Surgeon: Essie Hart, MD;  Location: [facility];  Service: Gynecology;  Laterality: N/A;   DILATION AND CURETTAGE OF UTERUS  07/17/2013   Procedure: Bartholin Cyst culture;  Surgeon: Essie Hart, MD;  Location: [facility];   Service: Gynecology;;   FLEXOR TENDON REPAIR Left 03/29/2014   Procedure: LEFT SMALL FINGER FLEXOR DIGITORIUM PROSUNDUS/FLEXOR DIGITORIUM SUPERFICIALIS AS NECESSARY TENDON FLEXOR REPAIR;  Surgeon: Dominica Severin, MD;  Location: [facility];  Service: Orthopedics;  Laterality: Left;   WISDOM TOOTH EXTRACTION      Family History  Problem Relation Age of Onset   Heart disease Mother    Hypertension Father    Diabetes Other     Social History   Socioeconomic History   Marital status: Single    Spouse name: Not on file   Number of children: Not on file   Years of education: Not on file   Highest education level: Not on file  Occupational History   Not on file  Tobacco Use   Smoking status: Some Days    Packs/day: 0.25    Types: Cigars, Cigarettes   Smokeless tobacco: Never  Substance and Sexual Activity   Alcohol use: Yes    Comment: OCC   Drug use: Yes    Types: Marijuana   Sexual activity: Yes    Birth control/protection: None  Other Topics Concern   Not on file  Social History Narrative   Not on file   Social Determinants of Health   Financial Resource Strain: Not on file  Food Insecurity: Food Insecurity Present   Worried  About Running Out of Food in the Last Year: Sometimes true   Ran Out of Food in the Last Year: Sometimes true  Transportation Needs: No Transportation Needs   Lack of Transportation (Medical): No   Lack of Transportation (Non-Medical): No  Physical Activity: Not on file  Stress: Not on file  Social Connections: Not on file  Intimate Partner Violence: Not on file    ROS Review of Systems  Genitourinary:        Discomfort on her labia due to recent I&D.  All other systems reviewed and are negative.  Objective:   Today's Vitals: BP 104/76   Pulse 84   Ht 5\' 3"  (1.6 m)   Wt 120 lb 9.6 oz (54.7 kg)   LMP 12/10/2020 (Exact Date)   SpO2 96%   BMI 21.36 kg/m   Physical Exam Vitals and nursing note reviewed.  Constitutional:      Appearance:  Normal appearance.  Cardiovascular:     Rate and Rhythm: Normal rate and regular rhythm.     Pulses: Normal pulses.     Heart sounds: Normal heart sounds.  Pulmonary:     Effort: Pulmonary effort is normal.     Breath sounds: Normal breath sounds.  Skin:    General: Skin is warm and dry.  Neurological:     Mental Status: She is alert and oriented to person, place, and time.  Psychiatric:     Comments: Very emotional    Assessment & Plan:   Problem List Items Addressed This Visit     Bipolar 1 disorder (HCC) - Primary    PHQ screening and GAD screening scores are elevated. Letter written for emotional support animal for patient today.  Referral placed for psychiatry in the setting of worsening anxiety and depression with hx of bipolar 1 d/o . No alarm symptoms present today.       Relevant Orders   Ambulatory referral to Psychiatry   Bartholin cyst    I&D done on 12/07/20 and Stent placement and removal on 12/17/20. Still reports discomfort to the area. No signs of infection reported. Suggested a pillow to use while sitting to help alleviate pressure. Referral for GYN placed for further evaluation and recommendations.       Relevant Orders   Ambulatory referral to Gynecology   Uninsured    Patient assistance information provided today to help with cost associated with uninsured patient.         Outpatient Encounter Medications as of 01/05/2021  Medication Sig   [DISCONTINUED] HYDROcodone-acetaminophen (NORCO/VICODIN) 5-325 MG tablet Take 1 tablet by mouth every 6 (six) hours as needed.   [DISCONTINUED] ibuprofen (ADVIL) 600 MG tablet Take 1 tablet (600 mg total) by mouth every 6 (six) hours as needed.   [DISCONTINUED] traMADol (ULTRAM) 50 MG tablet Take 1-2 tablets (50-100 mg total) by mouth every 6 (six) hours as needed for severe pain.   No facility-administered encounter medications on file as of 01/05/2021.    Follow-up: Return in about 3 months (around 04/07/2021)  for Sometime in next 3 months for CPE.   04/09/2021, NP    Patient seen along with NP student, Tollie Eth, RN at today's visit. Portions of documentation and assessment have been completed by the student listed.  [place]esto Charon, NP, have reviewed all documentation completed by the student. The documentation on 01/05/21 for the exam, diagnosis, procedures, and orders are all accurate and complete.  01/07/21, NP,have personally seen  and evaluated the patient during this encounter.  While the patient was in clinic, I reviewed the patient's medical history, the student's findings on physical examination, and the patient's diagnosis and treatment plan. All aspects of care were discussed with the the student and I agree with the information documented.   Adelina Collard, Sung Amabile, NP, DNp, AGNP-c Primary Care & Sports Medicine at [facility]

## 2021-01-05 NOTE — Progress Notes (Deleted)
Tollie Eth, DNP, AGNP-c Primary Care & Sports Medicine [address]  [address] [place], [place] [address] [phone]  New patient visit   Patient: Suzanne Holt   DOB: [birth year]   30 y.o. Female  MRN: [id number] Visit Date: 01/05/2021  Patient Care Team: Patient, No Pcp Per (Inactive) as PCP - General (General Practice)  Today's healthcare provider: Tollie Eth, NP   Chief Complaint  Patient presents with   Establish Care    No prior PCP    Bartholian Cyst    Patient was seen in the ED for a cysts. She had it drained and stented. She states the cysts has returned and she has some labial discomfort   Letter for School/Work    Patient is requesting a letter to state her pitbull is an Chief of Staff. She denies seeing BH in over 3 years   Subjective    Suzanne Holt is a 30 y.o. female who presents today as a new patient to establish care.  HPI History of anxiety  Past Medical History:  Diagnosis Date   Anemia    Bipolar 1 disorder (HCC)    Headache    Past Surgical History:  Procedure Laterality Date   BARTHOLIN CYST MARSUPIALIZATION N/A 07/17/2013   Procedure: BARTHOLIN CYST MARSUPIALIZATION;  Surgeon: Essie Hart, MD;  Location: [facility];  Service: Gynecology;  Laterality: N/A;   DILATION AND CURETTAGE OF UTERUS  07/17/2013   Procedure: Bartholin Cyst culture;  Surgeon: Essie Hart, MD;  Location: [facility];  Service: Gynecology;;   FLEXOR TENDON REPAIR Left 03/29/2014   Procedure: LEFT SMALL FINGER FLEXOR DIGITORIUM PROSUNDUS/FLEXOR DIGITORIUM SUPERFICIALIS AS NECESSARY TENDON FLEXOR REPAIR;  Surgeon: Dominica Severin, MD;  Location: [facility];  Service: Orthopedics;  Laterality: Left;   WISDOM TOOTH EXTRACTION     Family Status  Relation Name Status   Mother  Alive   Father  Alive   Other  (Not Specified)   Family History  Problem Relation Age of Onset   Heart disease Mother    Hypertension Father    Diabetes Other    Social  History   Socioeconomic History   Marital status: Single    Spouse name: Not on file   Number of children: Not on file   Years of education: Not on file   Highest education level: Not on file  Occupational History   Not on file  Tobacco Use   Smoking status: Some Days    Packs/day: 0.25    Types: Cigars, Cigarettes   Smokeless tobacco: Never  Substance and Sexual Activity   Alcohol use: Yes    Comment: OCC   Drug use: Yes    Types: Marijuana   Sexual activity: Yes    Birth control/protection: None  Other Topics Concern   Not on file  Social History Narrative   Not on file   Social Determinants of Health   Financial Resource Strain: Not on file  Food Insecurity: Food Insecurity Present   Worried About Running Out of Food in the Last Year: Sometimes true   Ran Out of Food in the Last Year: Sometimes true  Transportation Needs: No Transportation Needs   Lack of Transportation (Medical): No   Lack of Transportation (Non-Medical): No  Physical Activity: Not on file  Stress: Not on file  Social Connections: Not on file   Outpatient Medications Prior to Visit  Medication Sig   [DISCONTINUED] HYDROcodone-acetaminophen (NORCO/VICODIN) 5-325 MG tablet Take 1 tablet by  mouth every 6 (six) hours as needed.   [DISCONTINUED] ibuprofen (ADVIL) 600 MG tablet Take 1 tablet (600 mg total) by mouth every 6 (six) hours as needed.   [DISCONTINUED] traMADol (ULTRAM) 50 MG tablet Take 1-2 tablets (50-100 mg total) by mouth every 6 (six) hours as needed for severe pain.   No facility-administered medications prior to visit.   No Known Allergies  Immunization History  Administered Date(s) Administered   Tdap 03/25/2014    Health Maintenance  Topic Date Due   COVID-19 Vaccine (1) Never done   Pneumococcal Vaccine 25-88 Years old (1 - PCV) Never done   HIV Screening  Never done   Hepatitis C Screening  Never done   PAP-Cervical Cytology Screening  Never done   PAP SMEAR-Modifier   Never done   INFLUENZA VACCINE  Never done   TETANUS/TDAP  03/25/2024   HPV VACCINES  Aged Out    Patient Care Team: Patient, No Pcp Per (Inactive) as PCP - General (General Practice)  Review of Systems All review of systems negative except what is listed in the HPI    Objective    BP 104/76   Pulse 84   Ht 5\' 3"  (1.6 m)   Wt 120 lb 9.6 oz (54.7 kg)   LMP 12/10/2020 (Exact Date)   SpO2 96%   BMI 21.36 kg/m  Physical Exam ***  Depression Screen PHQ 2/9 Scores 12/17/2020  PHQ - 2 Score 6  PHQ- 9 Score 20   No results found for any visits on 01/05/21.  Assessment & Plan      Problem List Items Addressed This Visit     Bipolar 1 disorder (HCC) - Primary    PHQ screening and GAD screening scores are elevated. Letter written for emotional support animal. Referral placed for Psychiatry to help manage anxiety and depression.      Relevant Orders   Ambulatory referral to Psychiatry   Bartholin cyst    I&D done on 12/07/20 and Stent placement and removal on 12/17/20. Still reports discomfort. Suggested a pillow to use while sitting to help alleviate pressure. Referral for GYN placed.       Relevant Orders   Ambulatory referral to Gynecology   Uninsured     Return in about 3 months (around 04/07/2021) for Sometime in next 3 months for CPE.      Suzanne Holt, 04/09/2021, NP, DNP, AGNP-C Primary Care & Sports Medicine at [facility]

## 2021-01-06 NOTE — Telephone Encounter (Signed)
I called Suzanne Holt back and informed her per her provider note she did not have to shedule another visit if she not having any issues. She states she feels like there is a knot in vagina. I offered her next available appointment which she accepted. I informed her if it worsened to call  us back to see if we can do a work in appt. She informed me she has a pap scheduled which I verified thru [facility]. Sufian Ravi,RN

## 2021-01-07 ENCOUNTER — Encounter (HOSPITAL_BASED_OUTPATIENT_CLINIC_OR_DEPARTMENT_OTHER): Payer: Self-pay

## 2021-01-22 ENCOUNTER — Encounter: Payer: Self-pay | Admitting: Obstetrics & Gynecology

## 2021-01-22 ENCOUNTER — Ambulatory Visit (INDEPENDENT_AMBULATORY_CARE_PROVIDER_SITE_OTHER): Payer: Self-pay | Admitting: Obstetrics & Gynecology

## 2021-01-22 ENCOUNTER — Other Ambulatory Visit: Payer: Self-pay

## 2021-01-22 VITALS — BP 121/71 | HR 105 | Wt 121.9 lb

## 2021-01-22 DIAGNOSIS — Z5941 Food insecurity: Secondary | ICD-10-CM

## 2021-01-22 DIAGNOSIS — R4589 Other symptoms and signs involving emotional state: Secondary | ICD-10-CM

## 2021-01-22 DIAGNOSIS — N750 Cyst of Bartholin's gland: Secondary | ICD-10-CM

## 2021-01-22 MED ORDER — SULFAMETHOXAZOLE-TRIMETHOPRIM 800-160 MG PO TABS: 1.0000 | ORAL_TABLET | Freq: Two times a day (BID) | ORAL | 1 refills | Status: DC

## 2021-01-22 NOTE — Progress Notes (Addendum)
GYNECOLOGY OFFICE VISIT NOTE  History:   Suzanne Holt is a 30 y.o. G0P0000 here today for follow up after  I&D of recurrent left Bartholin's cyst and Word catheter placement, this was done in ED on 11/23/20. 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 Patient was seen in office here on 12/17/2020, balloon was removed.  It has since re accumulated, causing pressure and mild pain. She desires repeat I&D and Word catheter placement.  She denies any abnormal vaginal discharge, bleeding, fevers, pelvic pain or other concerns.    Past Medical History:  Diagnosis Date   Anemia    Bipolar 1 disorder (HCC)    Headache     Past Surgical History:  Procedure Laterality Date   BARTHOLIN CYST MARSUPIALIZATION N/A 07/17/2013   Procedure: BARTHOLIN CYST MARSUPIALIZATION;  Surgeon: Essie Hart, MD;  Location: [facility];  Service: Gynecology;  Laterality: N/A;   DILATION AND CURETTAGE OF UTERUS  07/17/2013   Procedure: Bartholin Cyst culture;  Surgeon: Essie Hart, MD;  Location: [facility];  Service: Gynecology;;   FLEXOR TENDON REPAIR Left 03/29/2014   Procedure: LEFT SMALL FINGER FLEXOR DIGITORIUM PROSUNDUS/FLEXOR DIGITORIUM SUPERFICIALIS AS NECESSARY TENDON FLEXOR REPAIR;  Surgeon: Dominica Severin, MD;  Location: [facility];  Service: Orthopedics;  Laterality: Left;   WISDOM TOOTH EXTRACTION      The following portions of the patient's history were reviewed and updated as appropriate: allergies, current medications, past family history, past medical history, past social history, past surgical history and problem list.   Review of Systems:  Pertinent items noted in HPI and remainder of comprehensive ROS otherwise negative.  Physical Exam:  BP 121/71   Pulse (!) 105   Wt 121 lb 14.4 oz (55.3 kg)   LMP 01/20/2021 (Exact Date)   BMI 21.59 kg/m  CONSTITUTIONAL: Well-developed, well-nourished female in no acute distress.  HEENT:  Normocephalic, atraumatic. External right and  left ear normal. No scleral icterus.  NECK: Normal range of motion, supple, no masses noted on observation SKIN: No rash noted. Not diaphoretic. No erythema. No pallor. MUSCULOSKELETAL: Normal range of motion. No edema noted. NEUROLOGIC: Alert and oriented to person, place, and time. Normal muscle tone coordination. No cranial nerve deficit noted. PSYCHIATRIC: Normal mood and affect. Normal behavior. Normal judgment and thought content. CARDIOVASCULAR: Normal heart rate noted RESPIRATORY: Effort and breath sounds normal, no problems with respiration noted ABDOMEN: No masses noted. No other overt distention noted.   PELVIC: 3 cm left Bartholin's gland cyst noted, fluctuant.  Mild erythema noted, mild tenderness to touch. Otherwise normal appearing external genitalia; normal urethral meatus; normal appearing distal vaginal mucosa. Patient is currently on her period.  Performed in the presence of a chaperone  Bartholin Cyst I&D and Word Catheter Placement  Written informed consent was obtained.  Discussed complications and possible outcomes of procedure including recurrence of cyst, scarring leading to infection, bleeding, dyspareunia, distortion of anatomy.  Repeat marsupialization declined due to cost.  Patient was examined in the dorsal lithotomy position and mass was identified around 5 o'clock.  The area was prepped with iodine and draped in a sterile manner. 1% Lidocaine (3 ml) was then used to infiltrate area on top of the cyst, behind the hymenal ring.  A 7 mm incision was made using a sterile scapel. Upon palpation of the mass, a moderate amount of brown, thick, drainage was expressed through the incision.  The open cyst was then cleansed, and a Word catheter was placed. 1.5  ml of sterile water was used to inflate the catheter balloon.  The end of the catheter was tucked into the vagina.  Patient tolerated the procedure well, reported feeling " a lot better."     Assessment and Plan:    1.  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  Dispense: 14 tablet; Refill: 1 prescribed for treatment - Recommended Sitz baths bid and Ibuprofen prn pain.   She was told to call to be examined if she experiences increasing swelling, pain, vaginal discharge, or fever.   - She was instructed to wear a peripad to absorb discharge, and to maintain pelvic rest while the Word catheter is in place.  -The catheter will be left in place for at least two to four weeks to promote formation of an epithelialized tract for permanent drainage of glandular secretions.  - Follow up appointment in 1- 2 weeks to be scheduled to make sure it is not getting worse/reaccumulating   2. Sad mood Patient and/or legal guardian verbally consented to [facility] services about presenting concerns and psychiatric consultation as appropriate. - Ambulatory referral to Integrated Behavioral Health  3. Food insecurity - AMBULATORY REFERRAL TO [facility]  Routine preventative health maintenance measures emphasized, already set up with [facility] on 02/10/21 for pap smear. Please refer to After Visit Summary for other counseling recommendations.   Return in about 1-2 weeks (around 01/29/2021) for Follow up Bartholin's I&D.    I spent 20 minutes dedicated to the care of this patient including pre-visit review of records, face to face time with the patient discussing her conditions and treatments and post visit orders.    Jaynie Collins, MD, FACOG Obstetrician & Gynecologist, [facility] for [employer], [facility]

## 2021-01-22 NOTE — Patient Instructions (Signed)
Bartholin's Cyst Incision and Drainage Bartholin's glands are two pea-sized glands located just outside the opening of the vagina. There is one on each side, inside the inner folds of skin of the vagina (labia minora). These glands secrete a fluid through tubes (ducts) that open into the labia minora. If a duct becomes blocked, a gland can swell and fill with mucus (Bartholin's cyst). Sometimes the gland becomes infected with bacteria and fills with pus (Bartholin's abscess). Incision and drainage is a surgical procedure to open and drain a cyst or abscess. You may need this surgery if: You have a large cyst that interferes with sexual activity. You have a painful cyst. You have a cyst that becomes infected and turns into an abscess. Tell your health care provider about: Any allergies you have. All medicines you are taking, including vitamins, herbs, eye drops, creams, and over-the-counter medicines. Any problems you or family members have had with anesthetic medicines. Any blood disorders you have. Any surgeries you have had. Any medical conditions you have. Whether you are pregnant or may be pregnant. What are the risks? Generally, this is a safe procedure. However, problems may occur, including: The cyst coming back (recurrence). This is the biggest risk. Bleeding. Infection of a cyst. Infection spreading from an abscess. Poor wound healing. What happens before the procedure? Surgery safety Ask your health care provider what steps will be taken to help prevent infection. These steps may include: Removing hair at the surgery site. Washing skin with a germ-killing soap. Taking antibiotic medicine. General instructions Ask your health care provider about: Changing or stopping your regular medicines. This is especially important if you are taking diabetes medicines or blood thinners. Taking medicines such as aspirin and ibuprofen. These medicines can thin your blood. Do not take these  medicines unless your health care provider tells you to take them. Taking over-the-counter medicines, vitamins, herbs, and supplements. Follow instructions from your health care provider about eating and drinking before surgery. Plan to have a responsible adult take you home from the hospital or clinic. What happens during the surgery? An IV may be inserted into one of your veins. You will be given one or more of the following: A medicine to help you relax (sedative). A medicine to numb the area (local anesthetic). A medicine to make you fall asleep (general anesthetic). Your vaginal area will be cleaned with a germ-killing solution. The area around the cyst or abscess will be injected with a local anesthetic. An incision will be made over the cyst or abscess. The contents of the cyst or abscess will be drained and the space (cavity) flushed out. A type of catheter called a Word catheter may be inserted into the cyst or abscess cavity. This catheter is left in place for about 4 weeks. You will go back to have the catheter removed. After removal, there will be a channel from the cyst or abscess cavity to the skin. This will help prevent recurrence. If you do not have a Word catheter placed, the edges of the incision will be stitched with absorbable sutures in a way to create a small opening for drainage (marsupialization). Both help prevent recurrence. The procedure may vary among health care providers and hospitals. What can I expect after the procedure? Your blood pressure, heart rate, breathing rate, and blood oxygen level will be monitored until you leave the hospital or clinic. If you were given a sedative during the procedure, it can affect you for several hours. Do not drive   or operate machinery until your health care provider says that it is safe. It is common to have light vaginal discharge. The discharge may be blood-tinged. You may also have mild pain or discomfort. Follow these  instructions at home: Medicines Take over-the-counter and prescription medicines only as told by your health care provider. If you were prescribed an antibiotic medicine, take or use it as told by your health care provider. Do not stop using the antibiotic even if you start to feel better. Infection signs Check your incision area every day for signs of infection. Check for: Redness, swelling, or pain. Fluid or blood. Pus or a bad smell. General instructions  Rest as told. Return to your normal activities as told by your health care provider. Ask your health care provider what activities are safe for you. Do not have vaginal sex or insert anything into your vagina until your health care provider says it is safe to do so. Wear an absorbent pad inside your underwear if you have any vaginal discharge. Take sitz baths as told by your health care provider. You may be told to start these 1 or 2 days after your procedure and to do them once or twice each day. Keep all follow-up visits. This is important. If you have a Word catheter, you will need to return to your health care provider to have it removed. Contact a health care provider if: You have chills or a fever. Medicine is not controlling your pain. You have signs of infection. Your Word catheter comes out. Summary Incision and drainage is a surgical procedure to open and drain a Bartholin's cyst or abscess. The main risk of this procedure is recurrence of the cyst or abscess. To prevent this recurrence, a Word catheter may be inserted. The incision can also be stitched in a way that allows drainage. After your procedure, it is common to have mild pain and light discharge from the vagina. Start taking sitz baths as told by your health care provider. Check your incision area daily for signs of infection. Do not have vaginal sex or insert anything into your vagina until your health care provider says it is safe. This information is not  intended to replace advice given to you by your health care provider. Make sure you discuss any questions you have with your health care provider. Document Revised: 07/29/2019 Document Reviewed: 07/29/2019 Elsevier Patient Education  2022 Elsevier Inc.  

## 2021-01-25 NOTE — BH Specialist Note (Deleted)
Integrated Behavioral Health via Telemedicine Visit  01/25/2021 Suzanne Holt [id number]  Number of Integrated Behavioral Health visits: 1 Session Start time: 8:15***  Session End time: 9:15*** Total time: {IBH Total Time:21014050}  Referring Provider: *** Patient/Family location: Home*** [facility] Provider [facility]: [facility]  All persons participating in visit: Patient *** and [facility] Suzanne Holt ***  Types of Service: {CHL AMB TYPE OF SERVICE:[phone]}  I connected with Suzanne Holt and/or Suzanne Holt's {family members:20773} via  Telephone or Engineer, civil (consulting)  (Video is Caregility application) and verified that I am speaking with the correct person using two identifiers. Discussed confidentiality: {YES/NO:21197}  I discussed the limitations of telemedicine and the availability of in person appointments.  Discussed there is a possibility of technology failure and discussed alternative modes of communication if that failure occurs.  I discussed that engaging in this telemedicine visit, they consent to the provision of behavioral healthcare and the services will be billed under their insurance.  Patient and/or legal guardian expressed understanding and consented to Telemedicine visit: {YES/NO:21197}  Presenting Concerns: Patient and/or family reports the following symptoms/concerns: *** Duration of problem: ***; Severity of problem: {Mild/Moderate/Severe:20260}  Patient and/or Family's Strengths/Protective Factors: {CHL AMB BH PROTECTIVE [phone]}  Goals Addressed: Patient will:  Reduce symptoms of: {IBH Symptoms:21014056}   Increase knowledge and/or ability of: {IBH Patient Tools:21014057}   Demonstrate ability to: {IBH Goals:21014053}  Progress towards Goals: {CHL AMB BH PROGRESS TOWARDS GOALS:2070801130}  Interventions: Interventions utilized:  {IBH  Interventions:21014054} Standardized Assessments completed: {IBH Screening Tools:21014051}  Patient and/or Family Response: ***  Assessment: Patient currently experiencing ***.   Patient may benefit from ***.  Plan: Follow up with behavioral health clinician on : *** Behavioral recommendations: *** Referral(s): {IBH Referrals:21014055}  I discussed the assessment and treatment plan with the patient and/or parent/guardian. They were provided an opportunity to ask questions and all were answered. They agreed with the plan and demonstrated an understanding of the instructions.   They were advised to call back or seek an in-person evaluation if the symptoms worsen or if the condition fails to improve as anticipated.  Suzanne Holt Suzanne Signore, LCSW

## 2021-01-27 ENCOUNTER — Telehealth: Payer: Self-pay | Admitting: Clinical

## 2021-01-27 ENCOUNTER — Ambulatory Visit: Payer: Self-pay | Admitting: Clinical

## 2021-01-27 NOTE — Telephone Encounter (Signed)
[facility] intern called pt in regards to [facility] appointment today. Pt asked to reschedule appointment for tomorrow 11/17 at 1:15 pm.

## 2021-01-28 ENCOUNTER — Ambulatory Visit: Payer: Self-pay | Admitting: Clinical

## 2021-01-28 DIAGNOSIS — F39 Unspecified mood [affective] disorder: Secondary | ICD-10-CM

## 2021-01-28 NOTE — BH Specialist Note (Signed)
Integrated Behavioral Health via Telemedicine Visit  01/28/2021 Jahni Paul [id number]  Number of Integrated Behavioral Health visits: 1/6 Session Start time: 1:16  Session End time: 2:17 Total time: 55  I reviewed patient visit with the [facility] Intern, and I concur with the treatment plan, as documented in the [facility] Intern note.   No charge for this visit due to [facility] Intern seeing patient.  Hulda Marin, MSW, LCSW Integrated Behavioral Health Clinician [facility] at [facility]   Referring Provider: Tereso Newcomer, MD Patient/Family location: [facility] Provider location: [facility] All persons participating in visit: Sameen Leas and Clelia Croft, [facility] Intern Types of Service: Individual psychotherapy and Video visit  I connected with [name] and/or [name] Gatta's self via  Telephone or [employer]  (Video is Caregility application) and verified that I am speaking with the correct person using two identifiers. Discussed confidentiality: Yes   I discussed the limitations of telemedicine and the availability of in person appointments.  Discussed there is a possibility of technology failure and discussed alternative modes of communication if that failure occurs.  I discussed that engaging in this telemedicine visit, they consent to the provision of behavioral healthcare and the services will be billed under their insurance.  Patient and/or legal guardian expressed understanding and consented to Telemedicine visit: Yes   Presenting Concerns: Patient and/or family reports the following symptoms/concerns: Pt. Has been experiencing difficultly sleeping, although has tried melatonin and tylenol pm. Pt has history of past trauma.  Duration of problem: Ongoing; Severity of problem: mild  Patient and/or Family's Strengths/Protective Factors: Concrete supports in place (healthy food, safe  environments, etc.)  Goals Addressed: Patient will:  Reduce symptoms of: depression, insomnia, and mood instability   Increase knowledge and/or ability of: coping skills, healthy habits, and self-management skills   Demonstrate ability to: Increase adequate support systems for patient/family and Decrease self-medicating behaviors  Progress towards Goals: Ongoing  Interventions: Interventions utilized:  Motivational Interviewing, Mindfulness or Management consultant, and Sleep Hygiene Standardized Assessments completed: Not Needed  Patient and/or Family Response: Pt agrees to treatment plan.  Assessment: Patient currently experiencing sleep disturbances and managing past trauma.    Patient may benefit from self-care; re-integrated journal and exercise back into pt lifestyle.  Plan: Follow up with behavioral health clinician on : 02/11/2021 at 1:15pm Behavioral recommendations: Learning and managing healthy coping styles Referral(s): Integrated [employer] (In Clinic)  I discussed the assessment and treatment plan with the patient and/or parent/guardian. They were provided an opportunity to ask questions and all were answered. They agreed with the plan and demonstrated an understanding of the instructions.   They were advised to call back or seek an in-person evaluation if the symptoms worsen or if the condition fails to improve as anticipated.  Clelia Croft

## 2021-01-29 ENCOUNTER — Other Ambulatory Visit: Payer: Self-pay

## 2021-01-29 ENCOUNTER — Encounter: Payer: Self-pay | Admitting: Obstetrics & Gynecology

## 2021-01-29 ENCOUNTER — Ambulatory Visit (INDEPENDENT_AMBULATORY_CARE_PROVIDER_SITE_OTHER): Payer: Self-pay | Admitting: Obstetrics & Gynecology

## 2021-01-29 VITALS — BP 118/78 | HR 98 | Ht 64.0 in | Wt 118.9 lb

## 2021-01-29 DIAGNOSIS — N750 Cyst of Bartholin's gland: Secondary | ICD-10-CM

## 2021-01-29 NOTE — Progress Notes (Addendum)
   GYNECOLOGY OFFICE VISIT NOTE  History:   Suzanne Holt is a 30 y.o. Suzanne Holt today for follow up after I&D, placement of Word catheter for left Bartholin's gland cyst on 01/22/2021.  She was prescribed Bactrim DS x 7 days which she is completing.  Reports having increased pain at site lately, making it hard to sit down. She denies any fevers, abnormal vaginal discharge, bleeding, pelvic pain or other concerns.    Past Medical History:  Diagnosis Date   Anemia    Bipolar 1 disorder (HCC)    Headache     Past Surgical History:  Procedure Laterality Date   BARTHOLIN CYST MARSUPIALIZATION N/A 07/17/2013   Procedure: BARTHOLIN CYST MARSUPIALIZATION;  Surgeon: Essie Hart, MD;  Location: [facility];  Service: Gynecology;  Laterality: N/A;   DILATION AND CURETTAGE OF UTERUS  07/17/2013   Procedure: Bartholin Cyst culture;  Surgeon: Essie Hart, MD;  Location: [facility];  Service: Gynecology;;   FLEXOR TENDON REPAIR Left 03/29/2014   Procedure: LEFT SMALL FINGER FLEXOR DIGITORIUM PROSUNDUS/FLEXOR DIGITORIUM SUPERFICIALIS AS NECESSARY TENDON FLEXOR REPAIR;  Surgeon: Dominica Severin, MD;  Location: [facility];  Service: Orthopedics;  Laterality: Left;   WISDOM TOOTH EXTRACTION      The following portions of the patient's history were reviewed and updated as appropriate: allergies, current medications, past family history, past medical history, past social history, past surgical history and problem list.   Review of Systems:  Pertinent items noted in HPI and remainder of comprehensive ROS otherwise negative.  Physical Exam:  BP 118/78   Pulse 98   Ht 5\' 4"  (1.626 m)   Wt 118 lb 14.4 oz (53.9 kg)   LMP 01/20/2021 (Exact Date)   BMI 20.41 kg/m  CONSTITUTIONAL: Well-developed, well-nourished female in no acute distress.  MUSCULOSKELETAL: Normal range of motion. No edema noted. NEUROLOGIC: Alert and oriented to person, place, and time. Normal muscle tone coordination. No cranial nerve deficit  noted. PSYCHIATRIC: Normal mood and affect. Normal behavior. Normal judgment and thought content. CARDIOVASCULAR: Normal heart rate noted RESPIRATORY: Effort and breath sounds normal, no problems with respiration noted ABDOMEN: No masses noted. No other overt distention noted.   PELVIC: Normal appearing external genitalia; normal urethral meatus.  Word catheter balloon noted to be mostly extruded from incision site on L Bartholin's gland, this was removed. There was some dark serosanguinous drainage note, no erythema, no tenderness. Patient reported feeling so much better after removal of catheter. Performed in the presence of a chaperone     Assessment and Plan:    1. Bartholin gland cyst Doing well. Discussed possibility of recurrence if opening re-seals prematurely, will reevaluate in one month or earlier if needed. Continue analgesics an complete antibiotic therapy as recommended. Routine preventative health maintenance measures emphasized, already set up with [facility] on 02/10/21 for pap smear.  Please refer to After Visit Summary for other counseling recommendations.   Return in about 1 month (around 02/28/2021) for Follow up Bartholin's cyst.    I spent 15 minutes dedicated to the care of this patient including pre-visit review of records, face to face time with the patient discussing her conditions and treatments and post visit orders.    03/02/2021, MD, FACOG Obstetrician & Gynecologist, [facility] for [facility]

## 2021-02-10 ENCOUNTER — Other Ambulatory Visit: Payer: Self-pay

## 2021-02-10 ENCOUNTER — Other Ambulatory Visit: Payer: Self-pay | Admitting: *Deleted

## 2021-02-10 DIAGNOSIS — Z124 Encounter for screening for malignant neoplasm of cervix: Secondary | ICD-10-CM

## 2021-02-10 NOTE — Progress Notes (Signed)
Patient: Suzanne Holt           Date of Birth: [birth year]           MRN: [id number] Visit Date: 02/10/2021 PCP: Tollie Eth, NP  Cervical Cancer Screening Do you smoke?: Yes Have you ever had or been told you have an allergy to latex products?: Yes Marital status: Single Date of last pap smear: More than 5 yrs ago ([facility]) Date of last menstrual period: 01/27/21 Number of pregnancies: 0 Number of births: 0 Have you ever had any of the following? Hysterectomy: No Tubal ligation (tubes tied): No Abnormal bleeding: Yes (irregular menses) Abnormal pap smear: No Venereal warts: No A sex partner with venereal warts: No A high risk* sex partner: No  Cervical Exam  Abnormal Observations: Patient has Bartholin's cyst that is being followed by [facility]. Patient has a follow-up appointment on 02/26/2021. Observed a scant thick white yeast appearing discharge in vagina. Patient stated that she recently completed antibiotics that were prescribed for her Bartholin's cyst. Told patient that if she notices any discharge or itching that she can try OTC Monistat. Recommendations: Last Pap smear was in 2014 at [facility] and normal per patient. Per patient has no history of an abnormal Pap smear. No Pap smear results are available in Epic. Let patient know will follow-up with her within the next couple of weeks with results of her Pap smear by phone. Informed patient that if today's Pap smear is normal that her next Pap smear is due in 3 years per [facility]CCP guidelines. Patient verbalized understanding.      Patient's History Patient Active Problem List   Diagnosis Date Noted   Bipolar 1 disorder (HCC) 01/05/2021   Bartholin cyst 01/05/2021   Uninsured 01/05/2021   Past Medical History:  Diagnosis Date   Anemia    Bipolar 1 disorder (HCC)    Headache     Family History  Problem Relation Age of Onset   Heart disease Mother     Hypertension Father    Diabetes Other     Social History   Occupational History   Not on file  Tobacco Use   Smoking status: Some Days    Packs/day: 0.25    Types: Cigars, Cigarettes   Smokeless tobacco: Never  Substance and Sexual Activity   Alcohol use: Yes    Comment: OCC   Drug use: Yes    Types: Marijuana   Sexual activity: Yes    Birth control/protection: None

## 2021-02-11 ENCOUNTER — Ambulatory Visit: Payer: Self-pay | Admitting: Clinical

## 2021-02-11 NOTE — BH Specialist Note (Signed)
Error/jcm

## 2021-02-12 LAB — CYTOLOGY - PAP: Diagnosis: NEGATIVE

## 2021-02-15 ENCOUNTER — Telehealth: Payer: Self-pay

## 2021-02-15 MED ORDER — METRONIDAZOLE 500 MG PO TABS: 500.0000 mg | ORAL_TABLET | Freq: Two times a day (BID) | ORAL | 0 refills | Status: DC

## 2021-02-15 NOTE — Telephone Encounter (Addendum)
Patient informed negative pap smear,positive for trichomonas. Patient informed per Dr. Deretha Emory recommendations:      ----- Message from Catalina Antigua, MD sent at 02/15/2021 10:41 AM EST ----- Please inform patient of trichomonas infection. Rx has been e-prescribed. Her partner needs to be informed and treated. They should both abstain from intercourse for 7 days following treatment

## 2021-02-15 NOTE — Addendum Note (Signed)
Addended by: Catalina Antigua on: 02/15/2021 10:41 AM   Modules accepted: Orders

## 2021-02-26 ENCOUNTER — Ambulatory Visit: Payer: Self-pay | Admitting: Obstetrics and Gynecology

## 2021-03-15 ENCOUNTER — Other Ambulatory Visit: Payer: Self-pay | Admitting: Obstetrics and Gynecology

## 2021-03-16 ENCOUNTER — Telehealth: Payer: Self-pay

## 2021-03-16 NOTE — Telephone Encounter (Signed)
Attempted to contact patient regarding refill request for metronidazole. "Mailbox full".

## 2021-03-18 ENCOUNTER — Telehealth: Payer: Self-pay

## 2021-03-18 NOTE — Telephone Encounter (Signed)
Patient sent pharmacy request for Metronidazole refill. Patient stated that she was afraid that she reinfected herself with trichomonas as she used sex toys without cleaning them properly. Patient informed to contact her GYN, Therapist, [facility] ([address]), contact information given. Patient agreed to contact [facility].

## 2021-03-23 ENCOUNTER — Ambulatory Visit: Payer: Self-pay

## 2021-04-01 ENCOUNTER — Ambulatory Visit (HOSPITAL_COMMUNITY): Payer: Self-pay | Admitting: Clinical

## 2021-04-07 ENCOUNTER — Encounter (HOSPITAL_BASED_OUTPATIENT_CLINIC_OR_DEPARTMENT_OTHER): Payer: Self-pay | Admitting: Nurse Practitioner

## 2021-04-08 ENCOUNTER — Ambulatory Visit (HOSPITAL_COMMUNITY): Payer: Self-pay | Admitting: Licensed Clinical Social Worker

## 2021-04-15 ENCOUNTER — Ambulatory Visit (INDEPENDENT_AMBULATORY_CARE_PROVIDER_SITE_OTHER): Payer: No Payment, Other | Admitting: Licensed Clinical Social Worker

## 2021-04-15 DIAGNOSIS — F3162 Bipolar disorder, current episode mixed, moderate: Secondary | ICD-10-CM
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
## 2021-04-16 ENCOUNTER — Encounter (HOSPITAL_BASED_OUTPATIENT_CLINIC_OR_DEPARTMENT_OTHER): Payer: Self-pay | Admitting: Nurse Practitioner

## 2021-05-06 ENCOUNTER — Other Ambulatory Visit: Payer: Self-pay

## 2021-05-06 ENCOUNTER — Ambulatory Visit (INDEPENDENT_AMBULATORY_CARE_PROVIDER_SITE_OTHER): Payer: No Payment, Other | Admitting: Psychiatry

## 2021-05-06 ENCOUNTER — Encounter (HOSPITAL_COMMUNITY): Payer: Self-pay | Admitting: Psychiatry

## 2021-05-06 ENCOUNTER — Other Ambulatory Visit (HOSPITAL_BASED_OUTPATIENT_CLINIC_OR_DEPARTMENT_OTHER): Payer: Self-pay

## 2021-05-06 VITALS — BP 117/74 | HR 105 | Temp 98.7°F | Ht 64.0 in | Wt 123.0 lb

## 2021-05-06 DIAGNOSIS — F319 Bipolar disorder, unspecified: Secondary | ICD-10-CM

## 2021-05-06 MED ORDER — ARIPIPRAZOLE 5 MG PO TABS
5.0000 mg | ORAL_TABLET | Freq: Every day | ORAL | 2 refills | Status: DC
  Filled 2021-05-07: qty 30, 30d supply, fill #0
  Filled 2021-06-17: qty 30, 30d supply, fill #1

## 2021-05-06 MED ORDER — TRAZODONE HCL 50 MG PO TABS
50.0000 mg | ORAL_TABLET | Freq: Every day | ORAL | 2 refills | Status: DC
  Filled 2021-05-06: qty 30, 30d supply, fill #0
  Filled 2021-06-17: qty 30, 30d supply, fill #1
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
## 2021-05-06 NOTE — Patient Instructions (Signed)
Follow up 2 weeks.

## 2021-05-07 ENCOUNTER — Other Ambulatory Visit (HOSPITAL_BASED_OUTPATIENT_CLINIC_OR_DEPARTMENT_OTHER): Payer: Self-pay

## 2021-05-20 ENCOUNTER — Encounter (HOSPITAL_COMMUNITY): Payer: No Payment, Other | Admitting: Psychiatry

## 2021-06-08 ENCOUNTER — Ambulatory Visit (INDEPENDENT_AMBULATORY_CARE_PROVIDER_SITE_OTHER): Payer: No Payment, Other | Admitting: Licensed Clinical Social Worker

## 2021-06-08 DIAGNOSIS — F3162 Bipolar disorder, current episode mixed, moderate: Secondary | ICD-10-CM | POA: Diagnosis not present

## 2021-06-09 NOTE — Progress Notes (Signed)
? ?THERAPIST PROGRESS NOTE ? ? ?Virtual Visit via Video Note ? ?I connected with Suzanne Holt on 06/08/21 at  3:00 PM EDT by a video enabled telemedicine application and verified that I am speaking with the correct person using two identifiers. ? ?Location: ?Patient: Home ?Provider: Home ?  ?I discussed the limitations of evaluation and management by telemedicine and the availability of in person appointments. The patient expressed understanding and agreed to proceed. ?I discussed the assessment and treatment plan with the patient. The patient was provided an opportunity to ask questions and all were answered. The patient agreed with the plan and demonstrated an understanding of the instructions. ?  ?The patient was advised to call back or seek an in-person evaluation if the symptoms worsen or if the condition fails to improve as anticipated. ? ?I provided 48 minutes of non-face-to-face time during this encounter. ? ?Participation Level: Active ? ?Behavioral Response: CasualAlertAnxious and Depressed ? ?Type of Therapy: Individual Therapy ? ?Treatment Goals addressed: anx/dep/grief/coping ? ?ProgressTowards Goals: Progressing ? ?Interventions: Solution Focused, Supportive, and Other: grief counseling/edu ? ?Summary: Suzanne Holt is a 31 y.o. female who presents with hx of Bipolar Dis.  Today patient logs on for video session per schedule.  This is first session since initial session April 15, 2021.  LCSW assessed for outcome of patient's medication management appointment.  Patient reports appointment went well and she is taking medications as prescribed.  She states she feels like the medication is making a significant difference.  She reports lower levels of irritability and states "I feel happier".  She advises she is sleeping well.  [name] states she continues to have the new job she was starting at [employer].  She is working third shift from 9 PM to 7 AM.  She states she is getting along well with  everyone at work and denies any irritable outbursts.  She gives a couple of examples of feeling irritable but was able to control her behavior without exhibiting anger.  Discussed the power of self talk and provided education on self talk.  LCSW provided education and instruction on deep breathing to help with coping.  LCSW explored patient's openness in addressing her biological mother's death in 04-15-2022 of last year.  She states she is open to talking about her mother and again confirms she knows little about the normal grief process. Suzanne Holt states her mother's death was a result of multiple health issues that mother ignored.  LCSW provided education on the stages of grief and typical reactions of grief along with myths about grief.  Patient becomes tearful addressing this topic, which was normalized. Pt verbalizes a better understanding of some of the feelings she has encountered. She is encouraged to continue to process/externalize grief.  LCSW informs patient of this clinician's resignation from [facility] behavioral health.  Assisted patient to process thoughts and feelings related to change in care at this early stage.  LCSW provided education on transition plan for new counselor.  Patient verbalizes understanding.  Patient and LCSW wish each other well.  Patient states appreciation for care provided. ? ?Suicidal/Homicidal: Nowithout intent/plan ? ?Therapist Response: Pt receptive to care. ? ?Plan: Return again for next avail appt with with new counselor as this clinician has resigned. ? ?Diagnosis: Bipolar disorder, current episode mixed, moderate (HCC) ? ?Collaboration of Care: Other Non deemed necessary this session. ? ?Patient/Guardian was advised Release of Information must be obtained prior to any record release in order to collaborate their care with an outside  provider. Patient/Guardian was advised if they have not already done so to contact the registration department to sign all necessary  forms in order for [place] to release information regarding their care.  ? ?Consent: Patient/Guardian gives verbal consent for treatment and assignment of benefits for services provided during this visit. Patient/Guardian expressed understanding and agreed to proceed.  ? ?Agenda Sink, LCSW ?06/09/2021 ? ?

## 2021-06-11 ENCOUNTER — Ambulatory Visit (HOSPITAL_COMMUNITY): Payer: No Payment, Other | Admitting: Licensed Clinical Social Worker

## 2021-06-17 ENCOUNTER — Other Ambulatory Visit (HOSPITAL_BASED_OUTPATIENT_CLINIC_OR_DEPARTMENT_OTHER): Payer: Self-pay

## 2021-06-30 ENCOUNTER — Ambulatory Visit (INDEPENDENT_AMBULATORY_CARE_PROVIDER_SITE_OTHER): Payer: No Payment, Other | Admitting: Licensed Clinical Social Worker

## 2021-06-30 ENCOUNTER — Encounter (HOSPITAL_COMMUNITY): Payer: Self-pay

## 2021-06-30 DIAGNOSIS — F411 Generalized anxiety disorder: Secondary | ICD-10-CM

## 2021-06-30 NOTE — Progress Notes (Signed)
Virtual Visit via Video Note ? ?I connected with Suzanne Holt on 06/30/21 at 10:00 AM EDT by a video enabled telemedicine application and verified that I am speaking with the correct person using two identifiers. ? ?Location: ?Patient: pt's home ?Provider: clinical office ?  ?I discussed the limitations of evaluation and management by telemedicine and the availability of in person appointments. The patient expressed understanding and agreed to proceed. ?  ?I discussed the assessment and treatment plan with the patient. The patient was provided an opportunity to ask questions and all were answered. The patient agreed with the plan and demonstrated an understanding of the instructions. ?  ?The patient was advised to call back or seek an in-person evaluation if the symptoms worsen or if the condition fails to improve as anticipated. ? ?I provided 38 minutes of non-face-to-face time during this encounter. ? ? ?Wyvonnia Lora, LCSWA ? ? ? ?THERAPIST PROGRESS NOTE ? ?Session Time: 38 minutes ? ?Participation Level: Active ? ?Behavioral Response: CasualAlertEuthymic ? ?Type of Therapy: Individual Therapy ? ?Treatment Goals addressed: establish tx goals ? ?ProgressTowards Goals: Initial ? ?Interventions: CBT and Supportive ? ?Summary: Suzanne Holt is a 31 y.o. female who presents for initial visit with this cln due to her previous therapist resigning. "I've definitely been dealing with anxiety." Reports symptoms have improved with meds, but still struggles with anger and anxiety. Saw a therapist a couple years ago, diagnosed with bipolar disorder at that time. Describes anxiety as heart racing, sweaty palms, and feeling uncomfortable. "I tend to zone out a lot, too." Reports anxiety typically in crowds and when alone. States she'll shut down, pace, and experiences self-deprecating thoughts. Denies panic attacks. Wants to work on confidence, putting herself down, and working on releasing anger. "I don't know if  this is normal, but it goes blank from age 8 to about 9. 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 Relays that her primary negative self-talk and thoughts involve feelings of "not being good enough." She cites her parents' feelings regarding her not graduating from college as being a source of not feeling good enough. She is agreeable to working on this cognition during the next scheduled session. She provides verbal consent for cln to electronically sign tx plan, which cln showed to pt via screen sharing and pt agrees to plan. Pt requests the spelling of her name be changed to "[place]" as "Suzanne Holt" is not the correct spelling.  ? ?Suicidal/Homicidal: Nowithout intent/plan ? ?Therapist Response: Cln introduced self and asked pt to identify pertinent background information, stressors, current symptoms, and treatment goals. Administered GAD-7 to assess current level of anxiety (score: 15). Cln created new tx plan and updated pt's demographics with the correct spelling of her name. Encouraged pt to provide feedback to cln during tx if needed. Cln scheduled f/u appointment and confirmed pt's availability and preferred method of service delivery. ? ?Plan: Return again in 3 weeks. ? ?Diagnosis: GAD (generalized anxiety disorder) ? ?Collaboration of Care: Other chart review of previous therapist's notes. ? ?Patient/Guardian was advised Release of Information must be obtained prior to any record release in order to collaborate their care with an outside provider. Patient/Guardian was advised if they have not already done so to contact the registration department to sign all necessary forms in order for [place] to release information regarding their care.  ? ?Consent: Patient/Guardian gives verbal consent for treatment and assignment of benefits for  services provided during this visit. Patient/Guardian expressed understanding and agreed to proceed.  ? ?Wyvonnia Lora, LCSWA ?06/30/2021 ? ?

## 2021-06-30 NOTE — Plan of Care (Signed)
?  Problem: Anxiety Disorder CCP Problem  1 Learn and Apply Coping Skills to Decrease Anxiety Symptoms   ?Goal:  Decrease negative self-talk, improve self-esteem, and reduce feelings of anger. ?Outcome: Not Applicable ?Goal: STG: Patient will participate in at least 80% of scheduled individual psychotherapy sessions ?Outcome: Not Applicable ?Goal: STG: Patient will complete at least 80% of assigned homework ?Outcome: Not Applicable ?Goal: STG: Report a decrease in anxiety symptoms as evidenced by an overall reduction in anxiety score by a minimum of 25% on the Generalized Anxiety Disorder Scale ?Outcome: Not Applicable ?  ?

## 2021-07-02 ENCOUNTER — Ambulatory Visit (HOSPITAL_COMMUNITY): Payer: Self-pay | Admitting: Licensed Clinical Social Worker

## 2021-07-20 NOTE — Progress Notes (Signed)
Cln signed on at 8:00 am, sent text link for video session per schedule, and remained online for session until 8:10 am. Pt failed to sign on for session. ?   ?

## 2021-07-21 ENCOUNTER — Encounter (HOSPITAL_COMMUNITY): Payer: Self-pay

## 2021-07-21 ENCOUNTER — Ambulatory Visit (HOSPITAL_COMMUNITY): Payer: BC Managed Care – PPO | Admitting: Licensed Clinical Social Worker

## 2021-07-23 ENCOUNTER — Ambulatory Visit (HOSPITAL_COMMUNITY): Payer: Self-pay | Admitting: Licensed Clinical Social Worker

## 2021-09-02 NOTE — BH Specialist Note (Signed)
error 

## 2021-10-20 DIAGNOSIS — N898 Other specified noninflammatory disorders of vagina: Secondary | ICD-10-CM | POA: Diagnosis not present

## 2021-10-20 DIAGNOSIS — N750 Cyst of Bartholin's gland: Secondary | ICD-10-CM | POA: Diagnosis not present

## 2021-10-22 DIAGNOSIS — N751 Abscess of Bartholin's gland: Secondary | ICD-10-CM | POA: Diagnosis not present

## 2021-11-10 DIAGNOSIS — N9089 Other specified noninflammatory disorders of vulva and perineum: Secondary | ICD-10-CM | POA: Diagnosis not present

## 2021-11-18 DIAGNOSIS — R198 Other specified symptoms and signs involving the digestive system and abdomen: Secondary | ICD-10-CM | POA: Diagnosis not present

## 2021-11-19 DIAGNOSIS — R198 Other specified symptoms and signs involving the digestive system and abdomen: Secondary | ICD-10-CM | POA: Diagnosis not present

## 2021-11-30 DIAGNOSIS — K5000 Crohn's disease of small intestine without complications: Secondary | ICD-10-CM | POA: Diagnosis not present

## 2021-11-30 DIAGNOSIS — R935 Abnormal findings on diagnostic imaging of other abdominal regions, including retroperitoneum: Secondary | ICD-10-CM | POA: Diagnosis not present

## 2021-12-16 ENCOUNTER — Encounter: Payer: Self-pay | Admitting: Obstetrics & Gynecology

## 2021-12-16 ENCOUNTER — Other Ambulatory Visit (HOSPITAL_COMMUNITY)
Admission: RE | Admit: 2021-12-16 | Discharge: 2021-12-16 | Disposition: A | Discharge status: Home / Self Care | Payer: BC Managed Care – PPO | Source: Ambulatory Visit | Attending: Obstetrics & Gynecology | Admitting: Obstetrics & Gynecology

## 2021-12-16 ENCOUNTER — Ambulatory Visit (INDEPENDENT_AMBULATORY_CARE_PROVIDER_SITE_OTHER): Payer: Self-pay | Admitting: Obstetrics & Gynecology

## 2021-12-16 ENCOUNTER — Telehealth: Payer: Self-pay

## 2021-12-16 VITALS — BP 121/83 | HR 94 | Wt 126.0 lb

## 2021-12-16 DIAGNOSIS — N9089 Other specified noninflammatory disorders of vulva and perineum: Secondary | ICD-10-CM

## 2021-12-16 DIAGNOSIS — Z113 Encounter for screening for infections with a predominantly sexual mode of transmission: Secondary | ICD-10-CM | POA: Diagnosis not present

## 2021-12-16 DIAGNOSIS — F319 Bipolar disorder, unspecified: Secondary | ICD-10-CM

## 2021-12-16 DIAGNOSIS — F411 Generalized anxiety disorder: Secondary | ICD-10-CM

## 2021-12-16 DIAGNOSIS — N750 Cyst of Bartholin's gland: Secondary | ICD-10-CM

## 2021-12-16 DIAGNOSIS — B3731 Acute candidiasis of vulva and vagina: Secondary | ICD-10-CM | POA: Diagnosis not present

## 2021-12-16 DIAGNOSIS — N760 Acute vaginitis: Secondary | ICD-10-CM | POA: Diagnosis not present

## 2021-12-16 NOTE — H&P (View-Only) (Signed)
GYNECOLOGY OFFICE VISIT NOTE  History:   Suzanne Holt is a 31 y.o. G0 here today for evaluation of recurrent left Bartholin's gland cyst.   On 11/23/2020, she had a left Bartholin's gland cyst and underwent I&D/Word catheter placement  but needed a repeat of I&D and Word catheter placement on 01/22/2021. Of note, patient also has history of recurrent right Bartholin's gland cysts which was helped with marsupialization in 2015. She reports that it has re-accumulated on her left side and she desires further management.  Also reports having abnormal vaginal discharge and desires STI screen.  She denies any abnormal vaginal bleeding, pelvic pain or other concerns.    Past Medical History:  Diagnosis Date   Anemia    Bipolar 1 disorder ([place])    Headache    Trichomonal vaginitis     Past Surgical History:  Procedure Laterality Date   BARTHOLIN CYST MARSUPIALIZATION N/A 07/17/2013   Procedure: BARTHOLIN CYST MARSUPIALIZATION;  Surgeon: Sanjuana Kava, MD;  Location: [facility];  Service: Gynecology;  Laterality: N/A;   DILATION AND CURETTAGE OF UTERUS  07/17/2013   Procedure: Bartholin Cyst culture;  Surgeon: Sanjuana Kava, MD;  Location: [facility];  Service: Gynecology;;   FLEXOR TENDON REPAIR Left 03/29/2014   Procedure: LEFT SMALL FINGER FLEXOR DIGITORIUM PROSUNDUS/FLEXOR DIGITORIUM SUPERFICIALIS AS NECESSARY TENDON FLEXOR REPAIR;  Surgeon: Roseanne Kaufman, MD;  Location: [facility];  Service: Orthopedics;  Laterality: Left;   WISDOM TOOTH EXTRACTION     The following portions of the patient's history were reviewed and updated as appropriate: allergies, current medications, past family history, past medical history, past social history, past surgical history and problem list.   Health Maintenance:  Normal pap on 02/10/2021.    Review of Systems:  Pertinent items noted in HPI and remainder of comprehensive ROS otherwise negative.  Physical Exam:  BP 121/83   Pulse 94   Wt 126 lb (57.2 kg)   LMP 11/29/2021  (Approximate)   BMI 21.63 kg/m  CONSTITUTIONAL: Well-developed, well-nourished female in no acute distress.  HEENT:  Normocephalic, atraumatic. External right and left ear normal. No scleral icterus.  NECK: Normal range of motion, supple, no masses noted on observation SKIN: No rash noted. Not diaphoretic. No erythema. No pallor. MUSCULOSKELETAL: Normal range of motion. No edema noted. NEUROLOGIC: Alert and oriented to person, place, and time. Normal muscle tone coordination. No cranial nerve deficit noted. PSYCHIATRIC: Normal mood and affect. Normal behavior. Normal judgment and thought content. CARDIOVASCULAR: Normal heart rate noted RESPIRATORY: Effort and breath sounds normal, no problems with respiration noted ABDOMEN: No masses noted. No other overt distention noted.   PELVIC: 3 cm left Bartholin's gland cyst noted, fluctuant.  No erythema noted, no tenderness to touch. Otherwise normal appearing external genitalia; normal urethral meatus; normal appearing distal vaginal mucosa. Thick white discharge noted, testing sample obtained.  Performed in the presence of a chaperone     Assessment and Plan:     1. GAD (generalized anxiety disorder) 2. Bipolar 1 disorder ([place]) Patient does not have a current provider and had elevated PHQ-9 and GAD-7 scores today.  No SI/HI.  Patient  verbally consented to [facility] services about presenting concerns. - Ambulatory referral to [facility]  3. Vaginal discharge - Cervicovaginal ancillary only( [place]) done, will follow up results and manage accordingly.  4. Routine screening for STI (sexually transmitted infection) STI screen done, will follow up results and manage accordingly. - Cervicovaginal ancillary only( [place]) - RPR+HBsAg+HCVAb+HIV  5. Bartholin gland cyst on left Recommended marsupialization given recurrence, and this helped the right one get resolved. She agreed to this plan.  Risks and  benefits of procedure discussed, also gave her information to review at home.    Patient was told that the likelihood that her condition and symptoms will be treated effectively with this surgical management was very high; the postoperative expectations were also discussed in detail. The patient also understands the alternative treatment options which were discussed in full. She was told that she will be contacted by our surgical scheduler regarding the time and date of her surgery; routine preoperative instructions will be given to her by the preoperative nursing team. Routine preventative health maintenance measures emphasized. Please refer to After Visit Summary for other counseling recommendations.   Return for Followup as needed.    I spent 30 minutes dedicated to the care of this patient including pre-visit review of records, face to face time with the patient discussing her conditions and treatments and post visit orders.    Jaynie Collins, MD, FACOG Obstetrician & Gynecologist, [facility] for [employer], [facility]

## 2021-12-16 NOTE — Patient Instructions (Signed)
   This is what will happen (on your left side)

## 2021-12-16 NOTE — Telephone Encounter (Signed)
Called patient, surgery date, time, location and preop instructions. Patient expressed understanding.

## 2021-12-16 NOTE — Progress Notes (Signed)
GYNECOLOGY OFFICE VISIT NOTE  History:   Suzanne Holt is a 31 y.o. G0 here today for evaluation of recurrent left Bartholin's gland cyst.   On 11/23/2020, she had a left Bartholin's gland cyst and underwent I&D/Word catheter placement  but needed a repeat of I&D and Word catheter placement on 01/22/2021. Of note, patient also has history of recurrent right Bartholin's gland cysts which was helped with marsupialization in 2015. She reports that it has re-accumulated on her left side and she desires further management.  Also reports having abnormal vaginal discharge and desires STI screen.  She denies any abnormal vaginal bleeding, pelvic pain or other concerns.    Past Medical History:  Diagnosis Date   Anemia    Bipolar 1 disorder ([place])    Headache    Trichomonal vaginitis     Past Surgical History:  Procedure Laterality Date   BARTHOLIN CYST MARSUPIALIZATION N/A 07/17/2013   Procedure: BARTHOLIN CYST MARSUPIALIZATION;  Surgeon: Sanjuana Kava, MD;  Location: [facility];  Service: Gynecology;  Laterality: N/A;   DILATION AND CURETTAGE OF UTERUS  07/17/2013   Procedure: Bartholin Cyst culture;  Surgeon: Sanjuana Kava, MD;  Location: [facility];  Service: Gynecology;;   FLEXOR TENDON REPAIR Left 03/29/2014   Procedure: LEFT SMALL FINGER FLEXOR DIGITORIUM PROSUNDUS/FLEXOR DIGITORIUM SUPERFICIALIS AS NECESSARY TENDON FLEXOR REPAIR;  Surgeon: Roseanne Kaufman, MD;  Location: [facility];  Service: Orthopedics;  Laterality: Left;   WISDOM TOOTH EXTRACTION     The following portions of the patient's history were reviewed and updated as appropriate: allergies, current medications, past family history, past medical history, past social history, past surgical history and problem list.   Health Maintenance:  Normal pap on 02/10/2021.    Review of Systems:  Pertinent items noted in HPI and remainder of comprehensive ROS otherwise negative.  Physical Exam:  BP 121/83   Pulse 94   Wt 126 lb (57.2 kg)   LMP 11/29/2021  (Approximate)   BMI 21.63 kg/m  CONSTITUTIONAL: Well-developed, well-nourished female in no acute distress.  HEENT:  Normocephalic, atraumatic. External right and left ear normal. No scleral icterus.  NECK: Normal range of motion, supple, no masses noted on observation SKIN: No rash noted. Not diaphoretic. No erythema. No pallor. MUSCULOSKELETAL: Normal range of motion. No edema noted. NEUROLOGIC: Alert and oriented to person, place, and time. Normal muscle tone coordination. No cranial nerve deficit noted. PSYCHIATRIC: Normal mood and affect. Normal behavior. Normal judgment and thought content. CARDIOVASCULAR: Normal heart rate noted RESPIRATORY: Effort and breath sounds normal, no problems with respiration noted ABDOMEN: No masses noted. No other overt distention noted.   PELVIC: 3 cm left Bartholin's gland cyst noted, fluctuant.  No erythema noted, no tenderness to touch. Otherwise normal appearing external genitalia; normal urethral meatus; normal appearing distal vaginal mucosa. Thick white discharge noted, testing sample obtained.  Performed in the presence of a chaperone     Assessment and Plan:     1. GAD (generalized anxiety disorder) 2. Bipolar 1 disorder ([place]) Patient does not have a current provider and had elevated PHQ-9 and GAD-7 scores today.  No SI/HI.  Patient  verbally consented to [facility] services about presenting concerns. - Ambulatory referral to [facility]  3. Vaginal discharge - Cervicovaginal ancillary only( [place]) done, will follow up results and manage accordingly.  4. Routine screening for STI (sexually transmitted infection) STI screen done, will follow up results and manage accordingly. - Cervicovaginal ancillary only( [place]) - RPR+HBsAg+HCVAb+HIV  5. Bartholin gland cyst on left Recommended marsupialization given recurrence, and this helped the right one get resolved. She agreed to this plan.  Risks and  benefits of procedure discussed, also gave her information to review at home.    Patient was told that the likelihood that her condition and symptoms will be treated effectively with this surgical management was very high; the postoperative expectations were also discussed in detail. The patient also understands the alternative treatment options which were discussed in full. She was told that she will be contacted by our surgical scheduler regarding the time and date of her surgery; routine preoperative instructions will be given to her by the preoperative nursing team. Routine preventative health maintenance measures emphasized. Please refer to After Visit Summary for other counseling recommendations.   Return for Followup as needed.    I spent 30 minutes dedicated to the care of this patient including pre-visit review of records, face to face time with the patient discussing her conditions and treatments and post visit orders.    Jaynie Collins, MD, FACOG Obstetrician & Gynecologist, [facility] for [employer], [facility]

## 2021-12-17 ENCOUNTER — Encounter (HOSPITAL_BASED_OUTPATIENT_CLINIC_OR_DEPARTMENT_OTHER): Payer: Self-pay | Admitting: Obstetrics & Gynecology

## 2021-12-17 LAB — CERVICOVAGINAL ANCILLARY ONLY
Bacterial Vaginitis (gardnerella): POSITIVE — AB
Candida Glabrata: NEGATIVE
Candida Vaginitis: POSITIVE — AB
Chlamydia: NEGATIVE
Comment: NEGATIVE
Comment: NEGATIVE
Comment: NEGATIVE
Comment: NEGATIVE
Comment: NEGATIVE
Comment: NORMAL
Neisseria Gonorrhea: NEGATIVE
Trichomonas: NEGATIVE

## 2021-12-17 LAB — RPR+HBSAG+HCVAB+...
HIV Screen 4th Generation wRfx: NONREACTIVE
Hep C Virus Ab: NONREACTIVE
Hepatitis B Surface Ag: NEGATIVE
RPR Ser Ql: NONREACTIVE

## 2021-12-17 NOTE — Progress Notes (Signed)
Spoke w/ via phone for pre-op interview--- pt Lab needs dos----  CBCdiff, urine preg             Lab results------ no COVID test -----patient states asymptomatic no test needed Arrive at ------- [id number] on 12-22-2021 NPO after MN NO Solid Food.  Clear liquids from MN until---  1145 Med rec completed Medications to take morning of surgery ----- none Diabetic medication ----- n/a Patient instructed no nail polish to be worn day of surgery Patient instructed to bring photo id and insurance card day of surgery Patient aware to have Driver (ride ) / caregiver for 24 hours after surgery --- mother, jacqueline Patient Special Instructions ----- n/a Pre-Op special Istructions ----- n/a Patient verbalized understanding of instructions that were given at this phone interview. Patient denies shortness of breath, chest pain, fever, cough at this phone interview.

## 2021-12-20 ENCOUNTER — Other Ambulatory Visit (HOSPITAL_BASED_OUTPATIENT_CLINIC_OR_DEPARTMENT_OTHER): Payer: Self-pay

## 2021-12-20 MED ORDER — FLUCONAZOLE 150 MG PO TABS
150.0000 mg | ORAL_TABLET | Freq: Once | ORAL | 3 refills | Status: AC
  Filled 2021-12-20: qty 1, 1d supply, fill #0

## 2021-12-20 MED ORDER — METRONIDAZOLE 500 MG PO TABS
500.0000 mg | ORAL_TABLET | Freq: Two times a day (BID) | ORAL | 0 refills | Status: AC
  Filled 2021-12-20: qty 14, 7d supply, fill #0

## 2021-12-20 NOTE — Addendum Note (Signed)
Addended by: Verita Schneiders A on: 12/20/2021 08:33 AM   Modules accepted: Orders

## 2021-12-21 NOTE — Progress Notes (Signed)
Talked to patient. Asked to come in at 1100 due to surgery time change. Clear liquids until 1000.

## 2021-12-22 ENCOUNTER — Ambulatory Visit (HOSPITAL_BASED_OUTPATIENT_CLINIC_OR_DEPARTMENT_OTHER): Payer: BC Managed Care – PPO | Admitting: Certified Registered Nurse Anesthetist

## 2021-12-22 ENCOUNTER — Encounter (HOSPITAL_BASED_OUTPATIENT_CLINIC_OR_DEPARTMENT_OTHER): Payer: Self-pay | Admitting: Obstetrics & Gynecology

## 2021-12-22 ENCOUNTER — Encounter (HOSPITAL_BASED_OUTPATIENT_CLINIC_OR_DEPARTMENT_OTHER)
Admission: RE | Disposition: A | Discharge status: Home / Self Care | Payer: Self-pay | Source: Home / Self Care | Attending: Obstetrics & Gynecology

## 2021-12-22 ENCOUNTER — Other Ambulatory Visit: Payer: Self-pay

## 2021-12-22 ENCOUNTER — Ambulatory Visit (HOSPITAL_BASED_OUTPATIENT_CLINIC_OR_DEPARTMENT_OTHER)
Admission: RE | Admit: 2021-12-22 | Discharge: 2021-12-22 | Disposition: A | Discharge status: Home / Self Care | Payer: BC Managed Care – PPO | Attending: Obstetrics & Gynecology | Admitting: Obstetrics & Gynecology

## 2021-12-22 DIAGNOSIS — N898 Other specified noninflammatory disorders of vagina: Secondary | ICD-10-CM | POA: Insufficient documentation

## 2021-12-22 DIAGNOSIS — F319 Bipolar disorder, unspecified: Secondary | ICD-10-CM | POA: Diagnosis not present

## 2021-12-22 DIAGNOSIS — Z01818 Encounter for other preprocedural examination: Secondary | ICD-10-CM

## 2021-12-22 DIAGNOSIS — F17200 Nicotine dependence, unspecified, uncomplicated: Secondary | ICD-10-CM | POA: Diagnosis not present

## 2021-12-22 DIAGNOSIS — F411 Generalized anxiety disorder: Secondary | ICD-10-CM | POA: Diagnosis not present

## 2021-12-22 DIAGNOSIS — N750 Cyst of Bartholin's gland: Secondary | ICD-10-CM | POA: Diagnosis not present

## 2021-12-22 HISTORY — DX: Gastro-esophageal reflux disease without esophagitis: K21.9

## 2021-12-22 HISTORY — DX: Personal history of other infectious and parasitic diseases: Z86.19

## 2021-12-22 HISTORY — DX: Cyst of Bartholin's gland: N75.0

## 2021-12-22 HISTORY — DX: Generalized anxiety disorder: F41.1

## 2021-12-22 HISTORY — PX: BARTHOLIN CYST MARSUPIALIZATION: SHX5383

## 2021-12-22 LAB — CBC WITH DIFFERENTIAL/PLATELET
Abs Immature Granulocytes: 0.04 10*3/uL (ref 0.00–0.07)
Basophils Absolute: 0 10*3/uL (ref 0.0–0.1)
Basophils Relative: 0 %
Eosinophils Absolute: 0.1 10*3/uL (ref 0.0–0.5)
Eosinophils Relative: 1 %
HCT: 40.7 % (ref 36.0–46.0)
Hemoglobin: 13.4 g/dL (ref 12.0–15.0)
Immature Granulocytes: 0 %
Lymphocytes Relative: 40 %
Lymphs Abs: 4 10*3/uL (ref 0.7–4.0)
MCH: 29.5 pg (ref 26.0–34.0)
MCHC: 32.9 g/dL (ref 30.0–36.0)
MCV: 89.6 fL (ref 80.0–100.0)
Monocytes Absolute: 0.7 10*3/uL (ref 0.1–1.0)
Monocytes Relative: 7 %
Neutro Abs: 5.3 10*3/uL (ref 1.7–7.7)
Neutrophils Relative %: 52 %
Platelets: 230 10*3/uL (ref 150–400)
RBC: 4.54 MIL/uL (ref 3.87–5.11)
RDW: 12.5 % (ref 11.5–15.5)
WBC: 10.2 10*3/uL (ref 4.0–10.5)
nRBC: 0 % (ref 0.0–0.2)

## 2021-12-22 LAB — POCT PREGNANCY, URINE: Preg Test, Ur: NEGATIVE

## 2021-12-22 SURGERY — MARSUPIALIZATION, CYST, BARTHOLIN'S GLAND
Anesthesia: General | Site: Perineum | Laterality: Left

## 2021-12-22 MED ORDER — DEXAMETHASONE SODIUM PHOSPHATE 10 MG/ML IJ SOLN
INTRAMUSCULAR | Status: DC | PRN
  Administered 2021-12-22: 10 mg via INTRAVENOUS

## 2021-12-22 MED ORDER — PROPOFOL 10 MG/ML IV BOLUS
INTRAVENOUS | Status: AC
  Filled 2021-12-22: qty 20

## 2021-12-22 MED ORDER — IBUPROFEN 600 MG PO TABS: 600.0000 mg | ORAL_TABLET | Freq: Four times a day (QID) | ORAL | 2 refills | Status: DC | PRN

## 2021-12-22 MED ORDER — LACTATED RINGERS IV SOLN: INTRAVENOUS | Status: DC

## 2021-12-22 MED ORDER — ONDANSETRON HCL 4 MG/2ML IJ SOLN
INTRAMUSCULAR | Status: DC | PRN
  Administered 2021-12-22: 4 mg via INTRAVENOUS

## 2021-12-22 MED ORDER — PHENYLEPHRINE 80 MCG/ML (10ML) SYRINGE FOR IV PUSH (FOR BLOOD PRESSURE SUPPORT)
PREFILLED_SYRINGE | INTRAVENOUS | Status: AC
  Filled 2021-12-22: qty 10

## 2021-12-22 MED ORDER — OXYCODONE HCL 5 MG PO TABS: 5.0000 mg | ORAL_TABLET | ORAL | 0 refills | Status: DC | PRN

## 2021-12-22 MED ORDER — KETOROLAC TROMETHAMINE 30 MG/ML IJ SOLN
INTRAMUSCULAR | Status: AC
  Filled 2021-12-22: qty 1

## 2021-12-22 MED ORDER — LIDOCAINE-EPINEPHRINE 1 %-1:100000 IJ SOLN
INTRAMUSCULAR | Status: AC
  Filled 2021-12-22: qty 1

## 2021-12-22 MED ORDER — MIDAZOLAM HCL 2 MG/2ML IJ SOLN
INTRAMUSCULAR | Status: AC
  Filled 2021-12-22: qty 2

## 2021-12-22 MED ORDER — GLYCOPYRROLATE PF 0.2 MG/ML IJ SOSY
PREFILLED_SYRINGE | INTRAMUSCULAR | Status: AC
  Filled 2021-12-22: qty 1

## 2021-12-22 MED ORDER — CELECOXIB 200 MG PO CAPS
ORAL_CAPSULE | ORAL | Status: AC
  Filled 2021-12-22: qty 2

## 2021-12-22 MED ORDER — SULFAMETHOXAZOLE-TRIMETHOPRIM 800-160 MG PO TABS
1.0000 | ORAL_TABLET | Freq: Two times a day (BID) | ORAL | Status: DC
  Administered 2021-12-22: 1 via ORAL
  Filled 2021-12-22: qty 1

## 2021-12-22 MED ORDER — ACETAMINOPHEN 500 MG PO TABS
1000.0000 mg | ORAL_TABLET | ORAL | Status: AC
  Administered 2021-12-22: 1000 mg via ORAL

## 2021-12-22 MED ORDER — DEXMEDETOMIDINE HCL IN NACL 200 MCG/50ML IV SOLN
INTRAVENOUS | Status: DC | PRN
  Administered 2021-12-22: 12 ug via INTRAVENOUS

## 2021-12-22 MED ORDER — MIDAZOLAM HCL 5 MG/5ML IJ SOLN
INTRAMUSCULAR | Status: DC | PRN
  Administered 2021-12-22: 2 mg via INTRAVENOUS

## 2021-12-22 MED ORDER — PROPOFOL 10 MG/ML IV BOLUS
INTRAVENOUS | Status: DC | PRN
  Administered 2021-12-22: 150 mg via INTRAVENOUS

## 2021-12-22 MED ORDER — GLYCOPYRROLATE PF 0.2 MG/ML IJ SOSY
PREFILLED_SYRINGE | INTRAMUSCULAR | Status: DC | PRN
  Administered 2021-12-22: .2 mg via INTRAVENOUS

## 2021-12-22 MED ORDER — GABAPENTIN 300 MG PO CAPS
ORAL_CAPSULE | ORAL | Status: AC
  Filled 2021-12-22: qty 1

## 2021-12-22 MED ORDER — FENTANYL CITRATE (PF) 100 MCG/2ML IJ SOLN
INTRAMUSCULAR | Status: DC | PRN
  Administered 2021-12-22: 100 ug via INTRAVENOUS

## 2021-12-22 MED ORDER — GABAPENTIN 300 MG PO CAPS
300.0000 mg | ORAL_CAPSULE | ORAL | Status: AC
  Administered 2021-12-22: 300 mg via ORAL

## 2021-12-22 MED ORDER — CELECOXIB 200 MG PO CAPS
400.0000 mg | ORAL_CAPSULE | ORAL | Status: AC
  Administered 2021-12-22: 400 mg via ORAL

## 2021-12-22 MED ORDER — EPHEDRINE SULFATE-NACL 50-0.9 MG/10ML-% IV SOSY
PREFILLED_SYRINGE | INTRAVENOUS | Status: DC | PRN
  Administered 2021-12-22: 5 mg via INTRAVENOUS

## 2021-12-22 MED ORDER — DOCUSATE SODIUM 100 MG PO CAPS: 100.0000 mg | ORAL_CAPSULE | Freq: Two times a day (BID) | ORAL | 2 refills | Status: DC | PRN

## 2021-12-22 MED ORDER — FENTANYL CITRATE (PF) 100 MCG/2ML IJ SOLN
INTRAMUSCULAR | Status: AC
  Filled 2021-12-22: qty 2

## 2021-12-22 MED ORDER — LIDOCAINE 2% (20 MG/ML) 5 ML SYRINGE
INTRAMUSCULAR | Status: DC | PRN
  Administered 2021-12-22: 100 mg via INTRAVENOUS

## 2021-12-22 MED ORDER — ONDANSETRON HCL 4 MG/2ML IJ SOLN
INTRAMUSCULAR | Status: AC
  Filled 2021-12-22: qty 2

## 2021-12-22 MED ORDER — LIDOCAINE-EPINEPHRINE 1 %-1:100000 IJ SOLN
INTRAMUSCULAR | Status: DC | PRN
  Administered 2021-12-22: 7 mL
  Administered 2021-12-22: 3 mL

## 2021-12-22 MED ORDER — PHENYLEPHRINE 80 MCG/ML (10ML) SYRINGE FOR IV PUSH (FOR BLOOD PRESSURE SUPPORT)
PREFILLED_SYRINGE | INTRAVENOUS | Status: DC | PRN
  Administered 2021-12-22 (×2): 160 ug via INTRAVENOUS

## 2021-12-22 MED ORDER — EPHEDRINE 5 MG/ML INJ
INTRAVENOUS | Status: AC
  Filled 2021-12-22: qty 5

## 2021-12-22 MED ORDER — ACETAMINOPHEN 500 MG PO TABS
ORAL_TABLET | ORAL | Status: AC
  Filled 2021-12-22: qty 2

## 2021-12-22 MED ORDER — DEXAMETHASONE SODIUM PHOSPHATE 10 MG/ML IJ SOLN
INTRAMUSCULAR | Status: AC
  Filled 2021-12-22: qty 1

## 2021-12-22 SURGICAL SUPPLY — 17 items
BLADE SURG 11 STRL SS (BLADE) ×1 IMPLANT
GLOVE BIOGEL PI IND STRL 7.0 (GLOVE) ×1 IMPLANT
GLOVE ECLIPSE 7.0 STRL STRAW (GLOVE) ×1 IMPLANT
GOWN STRL REUS W/ TWL LRG LVL3 (GOWN DISPOSABLE) ×1 IMPLANT
GOWN STRL REUS W/TWL LRG LVL3 (GOWN DISPOSABLE) ×1
NEEDLE HYPO 22GX1.5 SAFETY (NEEDLE) ×1 IMPLANT
PACK VAGINAL WOMENS (CUSTOM PROCEDURE TRAY) ×1 IMPLANT
PAD OB MATERNITY 4.3X12.25 (PERSONAL CARE ITEMS) ×1 IMPLANT
SUT VIC AB 3-0 CT1 27 (SUTURE) ×1
SUT VIC AB 3-0 CT1 TAPERPNT 27 (SUTURE) IMPLANT
SUT VIC AB 3-0 SH 27 (SUTURE) ×1
SUT VIC AB 3-0 SH 27X BRD (SUTURE) IMPLANT
SWAB COLLECTION DEVICE MRSA (MISCELLANEOUS) IMPLANT
SWAB CULTURE ESWAB REG 1ML (MISCELLANEOUS) IMPLANT
SYR 20ML LL LF (SYRINGE) IMPLANT
SYR BULB IRRIG 60ML STRL (SYRINGE) IMPLANT
TOWEL OR 17X26 10 PK STRL BLUE (TOWEL DISPOSABLE) ×1 IMPLANT

## 2021-12-22 NOTE — Discharge Instructions (Addendum)
     No acetaminophen/Tylenol until after 6:15 pm today if needed.   No ibuprofen, Advil, Aleve, Motrin, ketorolac, meloxicam, naproxen, or other NSAIDS until after 6:15 pm today if needed.      Post Anesthesia Home Care Instructions  Activity: Get plenty of rest for the remainder of the day. A responsible individual must stay with you for 24 hours following the procedure.  For the next 24 hours, DO NOT: -Drive a car -Paediatric nurse -Drink alcoholic beverages -Take any medication unless instructed by your physician -Make any legal decisions or sign important papers.  Meals: Start with liquid foods such as gelatin or soup. Progress to regular foods as tolerated. Avoid greasy, spicy, heavy foods. If nausea and/or vomiting occur, drink only clear liquids until the nausea and/or vomiting subsides. Call your physician if vomiting continues.  Special Instructions/Symptoms: Your throat may feel dry or sore from the anesthesia or the breathing tube placed in your throat during surgery. If this causes discomfort, gargle with warm salt water. The discomfort should disappear within 24 hours.

## 2021-12-22 NOTE — Interval H&P Note (Signed)
History and Physical Interval Note 12/22/2021 12:06 PM  Suzanne Holt  has presented today for surgery, with the diagnosis of recurrent left Bartholin's gland cyst.  The various methods of treatment have been discussed with the patient and family. After consideration of risks, benefits and other options for treatment, the patient has consented to  Procedure(s): BARTHOLIN CYST MARSUPIALIZATION (Left) as a surgical intervention.  Risks of surgery discussed with the patient included but are not limited to: bleeding, infection which may require antibiotics, injury to surrounding organs, cosmetic appearance issues, need for additional procedures.  The patient's history has been reviewed, patient examined, no change in status, stable for surgery.  I have reviewed the patient's chart and labs.  Questions were answered to the patient's satisfaction.  To OR when ready.   Verita Schneiders, MD, [place] for [employer], [place]

## 2021-12-22 NOTE — Anesthesia Procedure Notes (Signed)
Procedure Name: LMA Insertion Date/Time: 12/22/2021 1:09 PM  Performed by: Rogers Blocker, CRNAPre-anesthesia Checklist: Patient identified, Emergency Drugs available, Suction available and Patient being monitored Patient Re-evaluated:Patient Re-evaluated prior to induction Oxygen Delivery Method: Circle System Utilized Preoxygenation: Pre-oxygenation with 100% oxygen Induction Type: IV induction Ventilation: Mask ventilation without difficulty LMA: LMA inserted LMA Size: 4.0 Number of attempts: 1 Placement Confirmation: positive ETCO2 Tube secured with: Tape Dental Injury: Teeth and Oropharynx as per pre-operative assessment

## 2021-12-22 NOTE — Op Note (Signed)
[place] [facility] PROCEDURE DATE: 12/22/2021  PREOPERATIVE DIAGNOSIS:  Recurrent Left Bartholin's Gland Cyst POSTOPERATIVE DIAGNOSIS: The same PROCEDURE: Marsupialization of Left Bartholin's Gland Cyst SURGEON:  Dr. Verita Schneiders  INDICATIONS: 31 y.o. G0P0000 here for management of Recurrent Left Bartholin's Gland Cyst after previous incision and drainage and Word catheter placement.  Risks of surgery were discussed with the patient including but not limited to: bleeding, infection which may require antibiotics, injury to surrounding organs, need for additional procedures, and other postoperative/anesthesia complications. Written informed consent was obtained.    FINDINGS:  3 cm left Bartholin's cyst with significant clear and gelatinous drainage.    ANESTHESIA:    General ESTIMATED BLOOD LOSS:10 ml SPECIMENS: None COMPLICATIONS: None immediate  PROCEDURE IN DETAIL:  The patient received oral Bactrim DS while in the preoperative area.  She was then taken to the operating room where general anesthesia was administered and was found to be adequate.  She was placed in the dorsal lithotomy position, and was prepped and draped in a sterile manner.  Her bladder was catheterized for clear, yellow urine. After an adequate timeout was performed, attention was turned to the left side of her vulva where the abscess was palpated. 1% lidocaine was injected and an incision was made over the Bartholin's gland cyst around the introitus, and into the cyst wall.  A significant amount of clear and gelatinous drainage was obtained.   A hemostat was used to break up loculations.  The cyst wall was everted and sutured into the edges of the vestibular mucosa using interrupted 3-0 Vicryl stitches.  The cyst was irrigated with normal saline, good hemostasis was noted. Sponge, needle and instrument counts were correct times three.  The patient tolerated the procedure well and was taken to the recovery area extubated, awake,  and in stable condition.  The patient will be discharged to home as per PACU criteria.  Routine postoperative instructions given.  She was prescribed Oxycodone, Ibuprofen and Colace.  She will follow up in the office in about two to three weeks for postoperative evaluation.    Verita Schneiders, MD, [place] for [employer], [place]

## 2021-12-22 NOTE — Transfer of Care (Signed)
Immediate Anesthesia Transfer of Care Note  Patient: Suzanne Holt  Procedure(s) Performed: BARTHOLIN CYST MARSUPIALIZATION (Left: Perineum)  Patient Location: PACU  Anesthesia Type:General  Level of Consciousness: awake, alert , oriented and patient cooperative  Airway & Oxygen Therapy: Patient Spontanous Breathing  Post-op Assessment: Report given to RN and Post -op Vital signs reviewed and stable  Post vital signs: Reviewed and stable  Last Vitals:  Vitals Value Taken Time  BP 119/79 12/22/21 1355  Temp    Pulse 65 12/22/21 1359  Resp 13 12/22/21 1359  SpO2 100 % 12/22/21 1359  Vitals shown include unvalidated device data.  Last Pain:  Vitals:   12/22/21 1150  TempSrc: Oral  PainSc: 0-No pain      Patients Stated Pain Goal: 5 (03/22/30 3557)  Complications: No notable events documented.

## 2021-12-22 NOTE — Anesthesia Preprocedure Evaluation (Signed)
Anesthesia Evaluation  Patient identified by MRN, date of birth, ID band Patient awake    Reviewed: Allergy & Precautions, H&P , NPO status , Patient's Chart, lab work & pertinent test results  Airway Mallampati: I       Dental no notable dental hx.    Pulmonary Current Smoker and Patient abstained from smoking.,    Pulmonary exam normal        Cardiovascular negative cardio ROS Normal cardiovascular exam     Neuro/Psych PSYCHIATRIC DISORDERS Anxiety Bipolar Disorder    GI/Hepatic   Endo/Other  negative endocrine ROS  Renal/GU   Female GU complaint     Musculoskeletal negative musculoskeletal ROS (+)   Abdominal Normal abdominal exam  (+)   Peds  Hematology   Anesthesia Other Findings   Reproductive/Obstetrics                             Anesthesia Physical  Anesthesia Plan  ASA: II  Anesthesia Plan: General   Post-op Pain Management:    Induction: Intravenous  PONV Risk Score and Plan: 3 and Ondansetron, Dexamethasone and Midazolam  Airway Management Planned: LMA  Additional Equipment: None  Intra-op Plan:   Post-operative Plan: Extubation in OR  Informed Consent: I have reviewed the patients History and Physical, chart, labs and discussed the procedure including the risks, benefits and alternatives for the proposed anesthesia with the patient or authorized representative who has indicated his/her understanding and acceptance.     Dental Advisory Given and Dental advisory given  Plan Discussed with: CRNA  Anesthesia Plan Comments:         Anesthesia Quick Evaluation

## 2021-12-22 NOTE — Anesthesia Postprocedure Evaluation (Signed)
Anesthesia Post Note  Patient: Suzanne Holt  Procedure(s) Performed: BARTHOLIN CYST MARSUPIALIZATION (Left: Perineum)     Patient location during evaluation: Phase II Anesthesia Type: General Level of consciousness: awake Pain management: pain level controlled Vital Signs Assessment: post-procedure vital signs reviewed and stable Respiratory status: spontaneous breathing Cardiovascular status: stable Postop Assessment: no apparent nausea or vomiting Anesthetic complications: no   No notable events documented.  Last Vitals:  Vitals:   12/22/21 1415 12/22/21 1430  BP: 121/80 115/72  Pulse: 72 77  Resp: 18 14  Temp:    SpO2: 100% 100%    Last Pain:  Vitals:   12/22/21 1430  TempSrc:   PainSc: 0-No pain                 Huston Foley

## 2021-12-24 ENCOUNTER — Encounter (HOSPITAL_BASED_OUTPATIENT_CLINIC_OR_DEPARTMENT_OTHER): Payer: Self-pay | Admitting: Obstetrics & Gynecology

## 2021-12-30 ENCOUNTER — Telehealth: Payer: Self-pay | Admitting: Clinical

## 2021-12-30 NOTE — Telephone Encounter (Signed)
Attempt call regarding referral; Left HIPPA-compliant message to call back Aritha Huckeba from [facility] at  [phone] (Ervin Rothbauer's office).    

## 2022-01-05 ENCOUNTER — Telehealth: Payer: Self-pay | Admitting: Clinical

## 2022-01-05 NOTE — Telephone Encounter (Signed)
Attempt call regarding referral; Unable to leave message as mailbox is full.  

## 2022-01-10 ENCOUNTER — Encounter: Payer: Self-pay | Admitting: Obstetrics and Gynecology

## 2022-01-10 ENCOUNTER — Ambulatory Visit: Payer: BC Managed Care – PPO | Admitting: Obstetrics and Gynecology

## 2022-01-10 ENCOUNTER — Other Ambulatory Visit: Payer: Self-pay

## 2022-01-10 ENCOUNTER — Other Ambulatory Visit (HOSPITAL_COMMUNITY)
Admission: RE | Admit: 2022-01-10 | Discharge: 2022-01-10 | Disposition: A | Discharge status: Home / Self Care | Payer: BC Managed Care – PPO | Source: Ambulatory Visit | Attending: Obstetrics and Gynecology | Admitting: Obstetrics and Gynecology

## 2022-01-10 VITALS — BP 111/66 | HR 68 | Ht 64.0 in | Wt 130.8 lb

## 2022-01-10 DIAGNOSIS — N898 Other specified noninflammatory disorders of vagina: Secondary | ICD-10-CM | POA: Diagnosis not present

## 2022-01-10 DIAGNOSIS — Z09 Encounter for follow-up examination after completed treatment for conditions other than malignant neoplasm: Secondary | ICD-10-CM

## 2022-01-10 NOTE — Progress Notes (Signed)
Obstetrics and Gynecology Visit Return Patient Evaluation  Appointment Date: 01/10/2022  Primary Care Provider: [place], [place] for [facility]  Chief Complaint: routine post op check  History of Present Illness:  Suzanne Holt is a 31 y.o. s/p 10/11 left bartholin's marsupialization with Dr. Harolyn Rutherford for recurrent left BG cyst.  Interval History: Since that time, she states that she is doing well.  Review of Systems: as noted in the History of Present Illness.  Medications: none  Allergies: is allergic to latex.  Physical Exam:  BP 111/66   Pulse 68   Ht 5\' 4"  (1.626 m)   Wt 130 lb 12.8 oz (59.3 kg)   LMP 11/29/2021 (Approximate)   BMI 22.45 kg/m  Body mass index is 22.45 kg/m. General appearance: Well nourished, well developed female in no acute distress.  Neuro/Psych:  Normal mood and affect.    Pelvic exam:  EGBUS:wnl. Left bartholin's gland, nttp, almost completely healed (some irritation at a few places where the old suture is naturally coming off)   Assessment: patient doing well  Plan:  1. Vaginal discharge - Cervicovaginal ancillary only( [place])  2. Postop check Healing well.  Recommend full six weeks of pelvic rest   RTC: PRN  Durene Romans MD Attending Center for [employer] [facility])

## 2022-01-11 LAB — CERVICOVAGINAL ANCILLARY ONLY
Bacterial Vaginitis (gardnerella): POSITIVE — AB
Candida Glabrata: NEGATIVE
Candida Vaginitis: NEGATIVE
Comment: NEGATIVE
Comment: NEGATIVE
Comment: NEGATIVE

## 2022-01-14 ENCOUNTER — Other Ambulatory Visit (HOSPITAL_BASED_OUTPATIENT_CLINIC_OR_DEPARTMENT_OTHER): Payer: Self-pay

## 2022-01-14 MED ORDER — METRONIDAZOLE 500 MG PO TABS
500.0000 mg | ORAL_TABLET | Freq: Two times a day (BID) | ORAL | 0 refills | Status: AC
  Filled 2022-01-14: qty 14, 7d supply, fill #0

## 2022-01-14 NOTE — Addendum Note (Signed)
Addended by: Aletha Halim on: 01/14/2022 11:22 AM   Modules accepted: Orders

## 2022-01-26 DIAGNOSIS — R933 Abnormal findings on diagnostic imaging of other parts of digestive tract: Secondary | ICD-10-CM | POA: Diagnosis not present

## 2022-01-26 DIAGNOSIS — R9389 Abnormal findings on diagnostic imaging of other specified body structures: Secondary | ICD-10-CM | POA: Diagnosis not present

## 2022-01-26 NOTE — BH Specialist Note (Signed)
Integrated Behavioral Health via Telemedicine Visit  02/07/2022 Sulema Braid [id number]  Number of Integrated Behavioral Health Clinician visits: 1- Initial Visit  Session Start time: 0850   Session End time: 0934  Total time in minutes: 44   Referring Provider: Jaynie Collins, MD Patient/Family location: Home [facility] Provider location: Center for [facility] at [facility]  All persons participating in visit: Patient Suzanne Holt and [facility] Zuriel Yeaman   Types of Service: Individual psychotherapy and Video visit  I connected with Ludwig Lean and/or June Leap Patron's  n/a  via  Telephone or Video Enabled Telemedicine Application  (Video is Caregility application) and verified that I am speaking with the correct person using two identifiers. Discussed confidentiality: Yes   I discussed the limitations of telemedicine and the availability of in person appointments.  Discussed there is a possibility of technology failure and discussed alternative modes of communication if that failure occurs.  I discussed that engaging in this telemedicine visit, they consent to the provision of behavioral healthcare and the services will be billed under their insurance.  Patient and/or legal guardian expressed understanding and consented to Telemedicine visit: Yes   Presenting Concerns: Patient and/or family reports the following symptoms/concerns: Anxiety, worry, irritability, poor sleep quality, poor appetite, fatigue, lack of concentration; loss of mother 03/15/2020 (holidays difficult); financial stress. Pt took Seroquel last in 2014 to treat bipolar disorder.  Duration of problem: Ongoing; Severity of problem: severe  Patient and/or Family's Strengths/Protective Factors: Social connections and Sense of purpose  Goals Addressed: Patient will:  Reduce symptoms of: anxiety, depression, mood instability, and stress   Increase knowledge and/or ability of:  self-management skills   Demonstrate ability to: Increase motivation to adhere to plan of care  Progress towards Goals: Ongoing  Interventions: Interventions utilized:  CBT Cognitive Behavioral Therapy Standardized Assessments completed: GAD-7 and PHQ 9  Patient and/or Family Response: Patient agrees with treatment plan.   Assessment: Patient currently experiencing Bipolar 1 disorder (as previously diagnosed); Psychosocial stress.   Patient may benefit from continued psychoeducation and brief therapeutic interventions regarding coping with symptoms of anxiety, depression, life stress .  Plan: Follow up with behavioral health clinician on : Two weeks Behavioral recommendations:  -Begin Worry Time strategy, as discussed. Start by setting up start and end time reminders on phone today; continue daily for two weeks. -Accept referral to [facility]; use walk-in hours as needed -Accept referral to [employer]  Referral(s): Integrated Art gallery manager (In Clinic) and [employer] [facility] (LME/Outside Clinic)  I discussed the assessment and treatment plan with the patient and/or parent/guardian. They were provided an opportunity to ask questions and all were answered. They agreed with the plan and demonstrated an understanding of the instructions.   They were advised to call back or seek an in-person evaluation if the symptoms worsen or if the condition fails to improve as anticipated.  Rae Lips, LCSW     02/07/2022    9:08 AM 04/15/2021    9:17 AM 01/22/2021   10:15 AM 12/17/2020    3:25 PM  Depression screen PHQ 2/9  Decreased Interest 1  1 3   Down, Depressed, Hopeless 1  1 3   PHQ - 2 Score 2  2 6   Altered sleeping 2  3 3   Tired, decreased energy 3  2 3   Change in appetite 3  2 2   Feeling bad or failure about yourself  3  2 3   Trouble concentrating 3  2 2  Moving slowly or fidgety/restless 1  1 1   Suicidal thoughts 0  0 0  PHQ-9 Score 17  14 20    Difficult doing work/chores    Not difficult at all     Information is confidential and restricted. Go to Review Flowsheets to unlock data.       02/07/2022    9:10 AM 06/30/2021   10:11 AM 01/22/2021   10:16 AM 12/17/2020    3:26 PM  GAD 7 : Generalized Anxiety Score  Nervous, Anxious, on Edge 3  2 2   Control/stop worrying 3  2 2   Worry too much - different things 3  3 2   Trouble relaxing 3  2 1   Restless 3  1 3   Easily annoyed or irritable 3  3 1   Afraid - awful might happen 3  1 0  Total GAD 7 Score 21  14 11   Anxiety Difficulty         Information is confidential and restricted. Go to Review Flowsheets to unlock data.

## 2022-01-31 ENCOUNTER — Other Ambulatory Visit (HOSPITAL_BASED_OUTPATIENT_CLINIC_OR_DEPARTMENT_OTHER): Payer: Self-pay

## 2022-02-07 ENCOUNTER — Ambulatory Visit (INDEPENDENT_AMBULATORY_CARE_PROVIDER_SITE_OTHER): Payer: BC Managed Care – PPO | Admitting: Clinical

## 2022-02-07 DIAGNOSIS — F319 Bipolar disorder, unspecified: Secondary | ICD-10-CM | POA: Diagnosis not present

## 2022-02-07 DIAGNOSIS — Z658 Other specified problems related to psychosocial circumstances: Secondary | ICD-10-CM

## 2022-02-07 NOTE — Patient Instructions (Signed)
Center for [facility] at [facility] for Women [address] [place], [place] [address] [phone] (main office) [phone] [facility] office)  [facility]  [address], [place], [place] [address] [phone] or [phone] [facility] 24/7 FOR ANYONE [address], [place], [place]  [phone] Fax: [phone] [email or web address] *Interpreters available *Accepts all insurance and uninsured for Urgent Care needs *Accepts Medicaid and uninsured for outpatient treatment (below)    ONLY FOR [facility]  Below:   Outpatient New Patient Assessment/Therapy Walk-ins:        Monday -Thursday 8am until slots are full.        Every Friday 1pm-4pm  (first come, first served)                   New Patient Psychiatry/Medication Management        Monday-Friday 8am-11am (first come, first served)              For all walk-ins we ask that you arrive by 7:15am, because patients will be seen in the order of arrival.    [employer] and Websites Here are a few free apps meant to help you to help yourself.  To find, try searching on the internet to see if the app is offered on Apple/Android devices. If your first choice doesn't come up on your device, the good news is that there are many choices! Play around with different apps to see which ones are helpful to you.    Calm This is an app meant to help increase calm feelings. Includes info, strategies, and tools for tracking your feelings.      Calm Harm  This app is meant to help with self-harm. Provides many 5-minute or 15-min coping strategies for doing instead of hurting yourself.       Healthy Minds Health Minds is a problem-solving tool to help deal with emotions and cope with stress you encounter wherever you are.      MindShift This app can help people cope with anxiety. Rather than trying to avoid anxiety, you can make an important shift and face  it.      MY3  MY3 features a support system, safety plan and resources with the goal of offering a tool to use in a time of need.       My Life My Voice  This mood journal offers a simple solution for tracking your thoughts, feelings and moods. Animated emoticons can help identify your mood.       Relax Melodies Designed to help with sleep, on this app you can mix sounds and meditations for relaxation.      Smiling Mind Smiling Mind is meditation made easy: it's a simple tool that helps put a smile on your mind.        Stop, Breathe & Think  A friendly, simple guide for people through meditations for mindfulness and compassion.  Stop, Breathe and Think Kids Enter your current feelings and choose a "mission" to help you cope. Offers videos for certain moods instead of just sound recordings.       Team Orange The goal of this tool is to help teens change how they think, act, and react. This app helps you focus on your own good feelings and experiences.      The [employer] Box The [employer] Box (VHB) contains simple tools to help patients with coping, relaxation, distraction, and positive thinking.

## 2022-02-14 NOTE — BH Specialist Note (Signed)
Integrated Behavioral Health Follow Up In-Person Visit  MRN: [id number] Name: Suzanne Holt  Number of Integrated Behavioral Health Clinician visits: 2- Second Visit  Session Start time: 0920   Session End time: 0954  Total time in minutes: 34   Types of Service: Individual psychotherapy  Interpretor:No. Interpretor Name and Language: n/a  Subjective: Suzanne Holt is a 31 y.o. female accompanied by  n/a Patient was referred by Jaynie Collins, MD for positive depression screening. Patient reports the following symptoms/concerns: Conflict with adoptive mother, stemming from unacknowledged childhood trauma; relationship issues; anxiety regarding upcoming first plane trip; open to implementing strategy to cope with anxiety while on the plane.  Duration of problem: Ongoing; Severity of problem: severe  Objective: Mood: Anxious and Affect: Appropriate Risk of harm to self or others: No plan to harm self or others  Life Context: Family and Social: Pt lives by herself School/Work: Working full time third shift Self-Care: Recognizing need for greater self-care Life Changes: Move to new home  Patient and/or Family's Strengths/Protective Factors: Social connections, Concrete supports in place (healthy food, safe environments, etc.), and Sense of purpose  Goals Addressed: Patient will:  Reduce symptoms of: anxiety, compulsions, mood instability, and stress   Increase knowledge and/or ability of: self-management skills   Demonstrate ability to: Increase motivation to adhere to plan of care  Progress towards Goals: Ongoing  Interventions: Interventions utilized:  Mindfulness or Relaxation Training Standardized Assessments completed: GAD-7 and PHQ 9  Patient and/or Family Response: Patient agrees with treatment plan.   Patient Centered Plan: Patient is on the following Treatment Plan(s): IBH Assessment: Patient currently experiencing Generalized anxiety disorder; Bipolar  1 disorder (both as previously diagnosed).   Patient may benefit from continued therapeutic interventions.  Plan: Follow up with behavioral health clinician on : Two weeks Behavioral recommendations:  -Continue implementing Worry Time strategy daily, as needed -Consider practicing Grounding strategy (5 things I see, 4 things I hear, 3 things I feel, 2 things I smell, 1 thing I taste) daily prior to plane trip, to be better prepared to use on the plane if needed -Read through After Visit Summary; use as needed  Referral(s): Integrated [employer] (In Clinic)  [place], [place]     02/21/2022   10:41 AM 02/07/2022    9:08 AM 04/15/2021    9:17 AM 01/22/2021   10:15 AM 12/17/2020    3:25 PM  Depression screen PHQ 2/9  Decreased Interest 2 1  1 3   Down, Depressed, Hopeless 1 1  1 3   PHQ - 2 Score 3 2  2 6   Altered sleeping 3 2  3 3   Tired, decreased energy 3 3  2 3   Change in appetite 2 3  2 2   Feeling bad or failure about yourself  3 3  2 3   Trouble concentrating 3 3  2 2   Moving slowly or fidgety/restless 2 1  1 1   Suicidal thoughts 0 0  0 0  PHQ-9 Score 19 17  14 20   Difficult doing work/chores     Not difficult at all     Information is confidential and restricted. Go to Review Flowsheets to unlock data.      02/21/2022   10:41 AM 02/07/2022    9:10 AM 06/30/2021   10:11 AM 01/22/2021   10:16 AM  GAD 7 : Generalized Anxiety Score  Nervous, Anxious, on Edge 2 3  2   Control/stop worrying 3 3  2   Worry too much -  different things 3 3  3   Trouble relaxing 3 3  2   Restless 3 3  1   Easily annoyed or irritable 2 3  3   Afraid - awful might happen 2 3  1   Total GAD 7 Score 18 21  14   Anxiety Difficulty         Information is confidential and restricted. Go to Review Flowsheets to unlock data.

## 2022-02-21 ENCOUNTER — Ambulatory Visit (INDEPENDENT_AMBULATORY_CARE_PROVIDER_SITE_OTHER): Payer: BC Managed Care – PPO | Admitting: Clinical

## 2022-02-21 DIAGNOSIS — F319 Bipolar disorder, unspecified: Secondary | ICD-10-CM | POA: Diagnosis not present

## 2022-02-21 DIAGNOSIS — F411 Generalized anxiety disorder: Secondary | ICD-10-CM

## 2022-02-21 DIAGNOSIS — Z658 Other specified problems related to psychosocial circumstances: Secondary | ICD-10-CM

## 2022-02-21 NOTE — Patient Instructions (Addendum)
[facility] at [facility] [address] [place], [place] [address] [phone] (main office) [phone] (Pearlean Sabina's office)  [facility]  [address], [place], [place] [address] [phone] or [phone] WALK-IN URGENT CARE 24/7 FOR ANYONE [address], [place], [place]  [phone] Fax: [phone] [email or web address] *Interpreters available *Accepts all insurance and uninsured for Urgent Care needs *Accepts Medicaid and uninsured for outpatient treatment (below)    ONLY FOR [place] Residents  Below:   Outpatient New Patient Assessment/Therapy Walk-ins:        Monday -Thursday 8am until slots are full.        Every Friday 1pm-4pm  (first come, first served)                   New Patient Psychiatry/Medication Management        Monday-Friday 8am-11am (first come, first served)              For all walk-ins we ask that you arrive by 7:15am, because patients will be seen in the order of arrival.    /Emotional Wellbeing Apps and Websites Here are a few free apps meant to help you to help yourself.  To find, try searching on the internet to see if the app is offered on Apple/Android devices. If your first choice doesn't come up on your device, the good news is that there are many choices! Play around with different apps to see which ones are helpful to you.    Calm This is an app meant to help increase calm feelings. Includes info, strategies, and tools for tracking your feelings.      Calm Harm  This app is meant to help with self-harm. Provides many 5-minute or 15-min coping strategies for doing instead of hurting yourself.       Healthy Minds Health Minds is a problem-solving tool to help deal with emotions and cope with stress you encounter wherever you are.      MindShift This app can help people cope with anxiety. Rather than trying to avoid anxiety, you can make an important shift and face  it.      MY3  MY3 features a support system, safety plan and resources with the goal of offering a tool to use in a time of need.       My Life My Voice  This mood journal offers a simple solution for tracking your thoughts, feelings and moods. Animated emoticons can help identify your mood.       Relax Melodies Designed to help with sleep, on this app you can mix sounds and meditations for relaxation.      Smiling Mind Smiling Mind is meditation made easy: it's a simple tool that helps put a smile on your mind.        Stop, Breathe & Think  A friendly, simple guide for people through meditations for mindfulness and compassion.  Stop, Breathe and Think Kids Enter your current feelings and choose a "mission" to help you cope. Offers videos for certain moods instead of just sound recordings.       Team Orange The goal of this tool is to help teens change how they think, act, and react. This app helps you focus on your own good feelings and experiences.      The Virtual Hope Box The Virtual Hope Box (VHB) contains simple tools to help patients with coping, relaxation, distraction, and positive thinking.     

## 2022-02-24 NOTE — BH Specialist Note (Unsigned)
Integrated Behavioral Health via Telemedicine Visit  02/24/2022 Suzanne Holt [id number]  Number of Integrated Behavioral Health Clinician visits: 2- Second Visit  Session Start time: 0920   Session End time: 0954  Total time in minutes: 34   Referring Provider: Jaynie Collins, MD Patient/Family location: Home*** [facility] Provider location: [facility] for [facility] at [facility] for Women  All persons participating in visit: Patient Suzanne Holt and [facility] Felcia Huebert ***  Types of Service: {CHL AMB TYPE OF SERVICE:[phone]}  I connected with Ludwig Lean and/or June Leap Modeste's {family members:20773} via  Telephone or Video Enabled Telemedicine Application  (Video is Caregility application) and verified that I am speaking with the correct person using two identifiers. Discussed confidentiality: {YES/NO:21197}  I discussed the limitations of telemedicine and the availability of in person appointments.  Discussed there is a possibility of technology failure and discussed alternative modes of communication if that failure occurs.  I discussed that engaging in this telemedicine visit, they consent to the provision of behavioral healthcare and the services will be billed under their insurance.  Patient and/or legal guardian expressed understanding and consented to Telemedicine visit: Yes   Presenting Concerns: Patient and/or family reports the following symptoms/concerns: *** Duration of problem: ***; Severity of problem: {Mild/Moderate/Severe:20260}  Patient and/or Family's Strengths/Protective Factors: {CHL AMB BH PROTECTIVE [phone]}  Goals Addressed: Patient will:  Reduce symptoms of: {IBH Symptoms:21014056}   Increase knowledge and/or ability of: {IBH Patient Tools:21014057}   Demonstrate ability to: {IBH Goals:21014053}  Progress towards Goals: {CHL AMB BH PROGRESS TOWARDS [phone]}  Interventions: Interventions utilized:   {IBH Interventions:21014054} Standardized Assessments completed: {IBH Screening Tools:21014051}  Patient and/or Family Response: Patient agrees with treatment plan. ***  Assessment: Patient currently experiencing ***.   Patient may benefit from continued therapeutic interventions***.  Plan: Follow up with behavioral health clinician on : *** Behavioral recommendations:  -*** -*** Referral(s): {IBH Referrals:21014055}  I discussed the assessment and treatment plan with the patient and/or parent/guardian. They were provided an opportunity to ask questions and all were answered. They agreed with the plan and demonstrated an understanding of the instructions.   They were advised to call back or seek an in-person evaluation if the symptoms worsen or if the condition fails to improve as anticipated.  Rae Lips, LCSW     02/21/2022   10:41 AM 02/07/2022    9:08 AM 04/15/2021    9:17 AM 01/22/2021   10:15 AM 12/17/2020    3:25 PM  Depression screen PHQ 2/9  Decreased Interest 2 1  1 3   Down, Depressed, Hopeless 1 1  1 3   PHQ - 2 Score 3 2  2 6   Altered sleeping 3 2  3 3   Tired, decreased energy 3 3  2 3   Change in appetite 2 3  2 2   Feeling bad or failure about yourself  3 3  2 3   Trouble concentrating 3 3  2 2   Moving slowly or fidgety/restless 2 1  1 1   Suicidal thoughts 0 0  0 0  PHQ-9 Score 19 17  14 20   Difficult doing work/chores     Not difficult at all     Information is confidential and restricted. Go to Review Flowsheets to unlock data.      02/21/2022   10:41 AM 02/07/2022    9:10 AM 06/30/2021   10:11 AM 01/22/2021   10:16 AM  GAD 7 : Generalized Anxiety Score  Nervous, Anxious, on Edge 2 3  2  Control/stop worrying 3 3  2   Worry too much - different things 3 3  3   Trouble relaxing 3 3  2   Restless 3 3  1   Easily annoyed or irritable 2 3  3   Afraid - awful might happen 2 3  1   Total GAD 7 Score 18 21  14   Anxiety Difficulty         Information is  confidential and restricted. Go to Review Flowsheets to unlock data.

## 2022-03-10 ENCOUNTER — Ambulatory Visit: Payer: BC Managed Care – PPO | Admitting: Clinical

## 2022-03-10 NOTE — BH Specialist Note (Signed)
Integrated Behavioral Health via Telemedicine Visit  03/24/2022 Suzanne Holt [id number]  Number of Integrated Behavioral Health Clinician visits: 3- Third Visit  Session Start time: 1119   Session End time: 0954  Total time in minutes: 34   Referring Provider: Jaynie Collins, MD Patient/Family location: Home [facility] Provider location: [facility] for [facility] at [facility]  All persons participating in visit: Patient Suzanne Holt and [facility] Duglas Heier   Types of Service: Individual psychotherapy and Telephone visit  I connected with Suzanne Holt and/or Suzanne Holt's  n/a  via  Telephone or Video Enabled Telemedicine Application  (Video is Caregility application) and verified that I am speaking with the correct person using two identifiers. Discussed confidentiality: Yes   I discussed the limitations of telemedicine and the availability of in person appointments.  Discussed there is a possibility of technology failure and discussed alternative modes of communication if that failure occurs.  I discussed that engaging in this telemedicine visit, they consent to the provision of behavioral healthcare and the services will be billed under their insurance.  Patient and/or legal guardian expressed understanding and consented to Telemedicine visit: Yes   Presenting Concerns: Patient and/or family reports the following symptoms/concerns: Processing recent interpersonal conflict and the decision to set healthy boundaries in this new year.  Duration of problem: Ongoing with increased stress the past two weeks; Severity of problem:  moderately severe  Patient and/or Family's Strengths/Protective Factors: Social connections, Concrete supports in place (healthy food, safe environments, etc.), Sense of purpose, and Physical Health (exercise, healthy diet, medication compliance, etc.)  Goals Addressed: Patient will:  Reduce symptoms of: anxiety,  compulsions, mood instability, and stress   Increase knowledge and/or ability of:  Setting healthy boundaries    Progress towards Goals: Ongoing  Interventions: Interventions utilized:  Manufacturing systems engineer and Supportive Reflection Standardized Assessments completed: Not Needed  Patient and/or Family Response: Patient agrees with treatment plan.   Assessment: Patient currently experiencing Bipolar 1 disorder and Generalized anxiety disorder (both previously diagnosed)  Patient may benefit from continued therapeutic interventions.  Plan: Follow up with behavioral health clinician on :Two weeks Behavioral recommendations:  -Continue plan to attend initial psychiatry appointment on 04/15/22 -Begin setting healthy boundaries with others ("no" to all requests for monetary requests; "no" to other requests as needed) daily for the next two weeks. Begin saying "yes" to your own peace and wellbeing.  Referral(s): Integrated [employer] (In Clinic)  I discussed the assessment and treatment plan with the patient and/or parent/guardian. They were provided an opportunity to ask questions and all were answered. They agreed with the plan and demonstrated an understanding of the instructions.   They were advised to call back or seek an in-person evaluation if the symptoms worsen or if the condition fails to improve as anticipated.  Rae Lips, LCSW     02/21/2022   10:41 AM 02/07/2022    9:08 AM 04/15/2021    9:17 AM 01/22/2021   10:15 AM 12/17/2020    3:25 PM  Depression screen PHQ 2/9  Decreased Interest 2 1  1 3   Down, Depressed, Hopeless 1 1  1 3   PHQ - 2 Score 3 2  2 6   Altered sleeping 3 2  3 3   Tired, decreased energy 3 3  2 3   Change in appetite 2 3  2 2   Feeling bad or failure about yourself  3 3  2 3   Trouble concentrating 3 3  2  2  Moving slowly or fidgety/restless 2 1  1 1   Suicidal thoughts 0 0  0 0  PHQ-9 Score 19 17  14 20   Difficult doing work/chores      Not difficult at all     Information is confidential and restricted. Go to Review Flowsheets to unlock data.      02/21/2022   10:41 AM 02/07/2022    9:10 AM 06/30/2021   10:11 AM 01/22/2021   10:16 AM  GAD 7 : Generalized Anxiety Score  Nervous, Anxious, on Edge 2 3  2   Control/stop worrying 3 3  2   Worry too much - different things 3 3  3   Trouble relaxing 3 3  2   Restless 3 3  1   Easily annoyed or irritable 2 3  3   Afraid - awful might happen 2 3  1   Total GAD 7 Score 18 21  14   Anxiety Difficulty         Information is confidential and restricted. Go to Review Flowsheets to unlock data.

## 2022-03-24 ENCOUNTER — Ambulatory Visit (INDEPENDENT_AMBULATORY_CARE_PROVIDER_SITE_OTHER): Payer: BC Managed Care – PPO | Admitting: Clinical

## 2022-03-24 DIAGNOSIS — F411 Generalized anxiety disorder: Secondary | ICD-10-CM

## 2022-03-24 DIAGNOSIS — F319 Bipolar disorder, unspecified: Secondary | ICD-10-CM | POA: Diagnosis not present

## 2022-03-24 NOTE — Patient Instructions (Signed)
Center for [facility] at [facility] [address] [place], [place] [address] [phone] (main office) [phone] (Breon Diss's office)   

## 2022-03-28 NOTE — BH Specialist Note (Addendum)
Integrated Behavioral Health via Telemedicine Visit  04/07/2022 Suzanne Holt [id number]  Number of Integrated Behavioral Health Clinician visits: 4- Fourth Visit  Session Start time: 0848   Session End time: 0929  Total time in minutes: 41   Referring Provider: Verita Schneiders, MD Patient/Family location: Home [facility] Provider location: Center for [place] [facility]  All persons participating in visit: Patient Suzanne Holt and [place]   Types of Service: Individual psychotherapy and Telephone visit  I connected with Marney Setting and/or Suzanne Holt's  n/a  via  Telephone or Video Enabled Telemedicine Application  (Video is Caregility application) and verified that I am speaking with the correct person using two identifiers. Discussed confidentiality: Yes   I discussed the limitations of telemedicine and the availability of in person appointments.  Discussed there is a possibility of technology failure and discussed alternative modes of communication if that failure occurs.  I discussed that engaging in this telemedicine visit, they consent to the provision of behavioral healthcare and the services will be billed under their insurance.  Patient and/or legal guardian expressed understanding and consented to Telemedicine visit: Yes   Presenting Concerns: Patient and/or family reports the following symptoms/concerns: Processing the outcomes of allowing others to take advantage of her kindness via financial support; managing the emotions that come up as a result; requests information for friend about accessing therapy.  Duration of problem: Ongoing; Severity of problem:  moderately severe  Patient and/or Family's Strengths/Protective Factors: Social connections, Concrete supports in place (healthy food, safe environments, etc.), Sense of purpose, and Physical Health (exercise, healthy diet, medication compliance, etc.)  Goals  Addressed: Patient will:  Reduce symptoms of: anxiety, compulsions, mood instability, and stress   Increase knowledge and/or ability of:  continue setting healthy boundaries    Demonstrate ability to: Increase motivation to adhere to plan of care  Progress towards Goals: Ongoing  Interventions: Interventions utilized:  Motivational Interviewing Standardized Assessments completed: Not Needed  Patient and/or Family Response: Patient agrees with treatment plan.   Assessment: Patient currently experiencing Bipolar 1 disorder and Generalized anxiety disorder (both previously diagnosed via psychiatry).   Patient may benefit from continued therapeutic interventions.  Plan: Follow up with behavioral health clinician on : Two weeks Behavioral recommendations:  -Attend initial psychiatry appointment on 04/15/22 -Resolve to say "no" to ALL requests for financial help today through the end of February 2024 (no matter the reason). Stand firm with this decision.  -Share resources on After Visit Summary with friend; use as needed Referral(s): [place] (In Clinic)  I discussed the assessment and treatment plan with the patient and/or parent/guardian. They were provided an opportunity to ask questions and all were answered. They agreed with the plan and demonstrated an understanding of the instructions.   They were advised to call back or seek an in-person evaluation if the symptoms worsen or if the condition fails to improve as anticipated.  Garlan Fair, LCSW     02/21/2022   10:41 AM 02/07/2022    9:08 AM 04/15/2021    9:17 AM 01/22/2021   10:15 AM 12/17/2020    3:25 PM  Depression screen PHQ 2/9  Decreased Interest 2 1  1 3   Down, Depressed, Hopeless 1 1  1 3   PHQ - 2 Score 3 2  2 6   Altered sleeping 3 2  3 3   Tired, decreased energy 3 3  2 3   Change in appetite 2 3  2  2  Feeling bad or failure about yourself  3 3  2 3   Trouble concentrating 3 3  2 2    Moving slowly or fidgety/restless 2 1  1 1   Suicidal thoughts 0 0  0 0  PHQ-9 Score 19 17  14 20   Difficult doing work/chores     Not difficult at all     Information is confidential and restricted. Go to Review Flowsheets to unlock data.      02/21/2022   10:41 AM 02/07/2022    9:10 AM 06/30/2021   10:11 AM 01/22/2021   10:16 AM  GAD 7 : Generalized Anxiety Score  Nervous, Anxious, on Edge 2 3  2   Control/stop worrying 3 3  2   Worry too much - different things 3 3  3   Trouble relaxing 3 3  2   Restless 3 3  1   Easily annoyed or irritable 2 3  3   Afraid - awful might happen 2 3  1   Total GAD 7 Score 18 21  14   Anxiety Difficulty         Information is confidential and restricted. Go to Review Flowsheets to unlock data.

## 2022-04-07 ENCOUNTER — Ambulatory Visit (INDEPENDENT_AMBULATORY_CARE_PROVIDER_SITE_OTHER): Payer: BC Managed Care – PPO | Admitting: Clinical

## 2022-04-07 DIAGNOSIS — F411 Generalized anxiety disorder: Secondary | ICD-10-CM

## 2022-04-07 DIAGNOSIS — F319 Bipolar disorder, unspecified: Secondary | ICD-10-CM

## 2022-04-07 NOTE — Patient Instructions (Signed)
Center for [facility] [place], [place] [address] [phone] (main office) [phone] ([place] office)  [place]:   What if I or someone I know is in crisis?  If you are thinking about harming yourself or having thoughts of suicide, or if you know someone who is, seek help right away.  Call your doctor or mental health care provider.  Call 911 or go to a hospital emergency room to get immediate help, or ask a friend or family member to help you do these things; IF YOU ARE IN [place], YOU MAY GO TO WALK-IN URGENT CARE 24/7 at [facility] (see below)  Call the [place] National Suicide Prevention Lifeline's toll-free, 24-hour hotline at 1-800-273-TALK 380-374-1556) or TTY: 1-800-799-4 TTY 340 838 3920) to talk to a trained counselor.  If you are in crisis, make sure you are not left alone.   If someone else is in crisis, make sure he or she is not left alone   24 Hour :   [facility]  [address], [place], [place] [address] [phone] or [place] 24/7  [facility] Mobile Crisis: [phone]  [place] National Suicide Hotline: (817)015-1598  [facility] [employer] (Domestic Violence, [place])  [phone]  [place]  [address], [place]  [address]   [phone] or [phone]   [place]: [phone] (8am-4pm) or [phone] (after hours)        [facility], [address], [place] at [place], [place] Fax: [phone] [email or web address] *Interpreters available *Accepts all insurance and uninsured for Urgent Care needs *Accepts Medicaid and uninsured for outpatient treatment   [facility]   Mon-Fri: 8am-5pm [place], [place], [place]); [phone]) [email or web address]  *Accepts Medicare  [facility] [place], Fri: 8am-4pm [place], [place], [place] [address] [phone] (phone); [phone] (fax) TaskTown.es  *Accepts Medicare  [facility] Mon-Fri: 9am-5pm  [address] [place], [place] (phone); [phone]  [email or web address]  *Accepts Medicaid  [facility] [place], 8:30am-12pm/1pm-2:30pm [address], [place], [place] (phone); [phone] (fax) www.fspcares.org  *Accepts Medicaid, sliding-scale*Bilingual services available  [facility] Mon-Fri, 8am-7pm [place], [place]  [phone](phone); [phone]) www.famsolutions.org  *Accepts Medicaid *Bilingual services available  [facility] Mon-Fri: 8am-5pm, Saturday by appointment only [place], [place], [place] (phone); [phone] (fax) [email or web address]   [facility] [address], [place], [place], [place] [email or web address]  *Free & reduced services for uninsured and underinsured individuals *Bilingual services for Spanish-speaking clients 21 and under  [facility], [address] [place], [place]); [phone]) RunningConvention.[email or web address]  *Bring your own interpreter at first visit *Accepts Medicare and [facility]  [place] Mon-Fri: 9am-5:30pm [address] [place], [place], [place] (phone), [phone] (fax) After hours crisis line: [phone] www.[email or web address]  *Accepts Medicare and Medicaid  [facility], 8am-6pm [address],  [place], [place] (phone); [phone] (fax) http://[email or web address]  *Subsidized costs available  [facility] Mon-Fri: 8am-4pm [address], [place], [place] [phone](phone); [phone]) www.psychotherapeuticservices.com  *Accepts Medicaid  [facility] Same day access hours: Mon-Fri, 8:30-3pm Crisis hours: Mon-Fri, 8am-5pm [address] [place], [place] (336) [place] Same day access hours:  Mon-Fri, 8:30-3pm Crisis hours: Mon-Fri, 8am-8pm [address] [place], [place] (phone); [phone] (fax) www.[email or web address]  *Accepts Medicaid and Medicare  [facility] Mon, [place], Fri: 9am-9pm Tues, Thurs: 9am-6pm [place], [place], [place] (phone); [phone] (fax) https://[email or web address]  *(Accepts Medicare and Medicaid; payment plans available)*Bilingual services available  [facility] [address], [place], [place] (phone); [phone] (fax) www.[email or web address]   [facility] [address], [place], [place], [place]  [email or web address]  *Bilingual services available  [facility]) Mon-Thurs: 8am-8pm [address] [place], [place], [place] (phone); [phone] (fax) [email or web address].[email or web address]  *Accepts Medicaid*Bilingual services available  [facility] [place]. [place], [address] (phone) DeadConnect.[email or web address]  *Accepts Medicaid *Bilingual services available  [facility] Mon-Fri, 9am-4:45pm [address] [place], [place] (phone); [phone] (fax) http://[email or web address]-[email or web address]  *[facility] Mon-Thurs: 8:30-8pm, Fri: 8:30am-7pm [address] [place], [place] (3rd  floor) [phone] (phone); [phone] (fax) VIPinterview.[email or web address]  *Accepts Medicaid; income-based reduced rates available  [facility] Mon-Fri: 8am-5pm [address], [place], [place] [address] [phone] (phone); [phone] (fax) [email or web address]://[email or web address]  *Accepts Medicaid*Bilingual services available   [facility] ([place])  [address], [place] [phone] www.[email or web address]  *Provides direct services to individuals in recovery from mental illness, including support groups, recovery skills classes, and one on one peer support  [facility] [employer] on [place]) [place] helpline: [phone]  [facility]MI [place] helpline: [phone] https://namiguilford.org  *A community hub for information relating to local resources and services for the friends and families of individuals living alongside a mental health condition, as well as the individuals themselves. Classes and support groups also provided

## 2022-04-08 NOTE — BH Specialist Note (Signed)
Pt did not arrive to video visit and did not answer the phone; Left HIPPA-compliant message to call back Lauriel Helin from [facility] [place] [facility] at  [phone] (Julionna Marczak's office).  ?; left MyChart message for patient.  ? ?

## 2022-04-15 ENCOUNTER — Ambulatory Visit (HOSPITAL_COMMUNITY): Payer: BC Managed Care – PPO | Admitting: Psychiatry

## 2022-04-21 ENCOUNTER — Ambulatory Visit: Payer: BC Managed Care – PPO | Admitting: Clinical

## 2022-04-21 DIAGNOSIS — Z91199 Patient's noncompliance with other medical treatment and regimen due to unspecified reason: Secondary | ICD-10-CM

## 2022-05-05 ENCOUNTER — Ambulatory Visit (HOSPITAL_COMMUNITY): Payer: BC Managed Care – PPO | Admitting: Psychiatry

## 2022-05-31 ENCOUNTER — Ambulatory Visit: Payer: BC Managed Care – PPO

## 2022-07-12 ENCOUNTER — Ambulatory Visit (INDEPENDENT_AMBULATORY_CARE_PROVIDER_SITE_OTHER): Payer: BC Managed Care – PPO

## 2022-07-12 ENCOUNTER — Other Ambulatory Visit (HOSPITAL_COMMUNITY)
Admission: RE | Admit: 2022-07-12 | Discharge: 2022-07-12 | Disposition: A | Discharge status: Home / Self Care | Payer: BC Managed Care – PPO | Source: Ambulatory Visit | Attending: Family Medicine | Admitting: Family Medicine

## 2022-07-12 VITALS — BP 117/79 | HR 77 | Wt 128.2 lb

## 2022-07-12 DIAGNOSIS — Z113 Encounter for screening for infections with a predominantly sexual mode of transmission: Secondary | ICD-10-CM | POA: Insufficient documentation

## 2022-07-12 NOTE — Progress Notes (Signed)
Here today for STD screening. Reports mildly abnormal vaginal odor-- would like testing for BV and yeast. Describes feeling a small bump on labia that she is unable to visualize. States it feels like an ingrown hair. Only causes pain when she is wiping in the shower, which she thinks she may be doing too aggressively. This has been present for 1 week. Recommended warm compresses and ibuprofen for any increased pain. Vaginal self swab completed and labs drawn.  LMP was 03/05/22. Does not think there is possibility of pregnancy; uses condoms with every encounter. Reports she has had very irregular periods since 2010-2011. May return for provider visit to discuss further if she desires.  Fleet Contras RN 07/12/22

## 2022-07-13 LAB — HEPATITIS B SURFACE ANTIGEN: Hepatitis B Surface Ag: NEGATIVE

## 2022-07-13 LAB — HIV ANTIBODY (ROUTINE TESTING W REFLEX): HIV Screen 4th Generation wRfx: NONREACTIVE

## 2022-07-13 LAB — HEPATITIS C ANTIBODY: Hep C Virus Ab: NONREACTIVE

## 2022-07-13 LAB — RPR: RPR Ser Ql: NONREACTIVE

## 2022-07-14 ENCOUNTER — Other Ambulatory Visit: Payer: Self-pay | Admitting: Family Medicine

## 2022-07-14 ENCOUNTER — Encounter: Payer: Self-pay | Admitting: Obstetrics and Gynecology

## 2022-07-14 LAB — CERVICOVAGINAL ANCILLARY ONLY
Bacterial Vaginitis (gardnerella): POSITIVE — AB
Candida Glabrata: NEGATIVE
Candida Vaginitis: POSITIVE — AB
Chlamydia: NEGATIVE
Comment: NEGATIVE
Comment: NEGATIVE
Comment: NEGATIVE
Comment: NEGATIVE
Comment: NEGATIVE
Comment: NORMAL
Neisseria Gonorrhea: NEGATIVE
Trichomonas: NEGATIVE

## 2022-07-14 MED ORDER — METRONIDAZOLE 0.75 % VA GEL: 1.0000 | Freq: Every day | VAGINAL | 0 refills | Status: AC

## 2022-07-14 MED ORDER — FLUCONAZOLE 150 MG PO TABS: 150.0000 mg | ORAL_TABLET | Freq: Once | ORAL | 0 refills | Status: AC

## 2023-01-03 ENCOUNTER — Other Ambulatory Visit (HOSPITAL_COMMUNITY)
Admission: RE | Admit: 2023-01-03 | Discharge: 2023-01-03 | Disposition: A | Discharge status: Home / Self Care | Payer: BC Managed Care – PPO | Source: Ambulatory Visit | Attending: Family Medicine | Admitting: Family Medicine

## 2023-01-03 ENCOUNTER — Other Ambulatory Visit: Payer: Self-pay

## 2023-01-03 ENCOUNTER — Ambulatory Visit (INDEPENDENT_AMBULATORY_CARE_PROVIDER_SITE_OTHER): Payer: BC Managed Care – PPO

## 2023-01-03 VITALS — BP 120/78 | HR 92 | Ht 64.0 in | Wt 127.4 lb

## 2023-01-03 DIAGNOSIS — Z113 Encounter for screening for infections with a predominantly sexual mode of transmission: Secondary | ICD-10-CM

## 2023-01-03 NOTE — Progress Notes (Signed)
Suzanne Holt is here with concern of STD; patient states that she recurrently gets "hair bumps" on her labia. No vaginal itching, no changes in discharge; patient states she has no concern for pregnancy. These symptoms have been present for 1-2 weeks. Patient states that she has not changed soaps, detergents, or body washes; however, she did shave for the first time in a while back in mid-August 2024.    Patient does have hx of BV and yeast infections.   Plan of care: Patient only wanted swab testing for STI/STD and yeast/BV   Self swab instructions given and specimen obtained. Explained patient will be contacted with any abnormal results. Patient is due for annual and has one scheduled for 01/30/23.    Meryl Crutch, RN 01/03/2023  2:05 PM

## 2023-01-04 ENCOUNTER — Other Ambulatory Visit: Payer: Self-pay | Admitting: Obstetrics and Gynecology

## 2023-01-04 DIAGNOSIS — B9689 Other specified bacterial agents as the cause of diseases classified elsewhere: Secondary | ICD-10-CM

## 2023-01-04 DIAGNOSIS — B3731 Acute candidiasis of vulva and vagina: Secondary | ICD-10-CM

## 2023-01-04 LAB — CERVICOVAGINAL ANCILLARY ONLY
Bacterial Vaginitis (gardnerella): POSITIVE — AB
Candida Glabrata: NEGATIVE
Candida Vaginitis: POSITIVE — AB
Chlamydia: NEGATIVE
Comment: NEGATIVE
Comment: NEGATIVE
Comment: NEGATIVE
Comment: NEGATIVE
Comment: NEGATIVE
Comment: NORMAL
Neisseria Gonorrhea: NEGATIVE
Trichomonas: NEGATIVE

## 2023-01-04 MED ORDER — METRONIDAZOLE 500 MG PO TABS: 500.0000 mg | ORAL_TABLET | Freq: Two times a day (BID) | ORAL | 0 refills | Status: AC

## 2023-01-04 MED ORDER — FLUCONAZOLE 150 MG PO TABS: 150.0000 mg | ORAL_TABLET | Freq: Once | ORAL | 1 refills | Status: AC

## 2023-01-30 ENCOUNTER — Other Ambulatory Visit: Payer: Self-pay

## 2023-01-30 ENCOUNTER — Encounter: Payer: Self-pay | Admitting: Obstetrics and Gynecology

## 2023-01-30 ENCOUNTER — Ambulatory Visit (INDEPENDENT_AMBULATORY_CARE_PROVIDER_SITE_OTHER): Payer: BC Managed Care – PPO | Admitting: Obstetrics and Gynecology

## 2023-01-30 ENCOUNTER — Other Ambulatory Visit (HOSPITAL_COMMUNITY)
Admission: RE | Admit: 2023-01-30 | Discharge: 2023-01-30 | Disposition: A | Discharge status: Home / Self Care | Payer: BC Managed Care – PPO | Source: Ambulatory Visit | Attending: Obstetrics and Gynecology | Admitting: Obstetrics and Gynecology

## 2023-01-30 VITALS — BP 113/78 | HR 103 | Wt 127.0 lb

## 2023-01-30 DIAGNOSIS — N760 Acute vaginitis: Secondary | ICD-10-CM

## 2023-01-30 DIAGNOSIS — B9689 Other specified bacterial agents as the cause of diseases classified elsewhere: Secondary | ICD-10-CM | POA: Diagnosis not present

## 2023-01-30 DIAGNOSIS — Z113 Encounter for screening for infections with a predominantly sexual mode of transmission: Secondary | ICD-10-CM | POA: Insufficient documentation

## 2023-01-30 DIAGNOSIS — Z01419 Encounter for gynecological examination (general) (routine) without abnormal findings: Secondary | ICD-10-CM | POA: Diagnosis not present

## 2023-01-30 DIAGNOSIS — Z1330 Encounter for screening examination for mental health and behavioral disorders, unspecified: Secondary | ICD-10-CM | POA: Diagnosis not present

## 2023-01-30 LAB — HEMOGLOBIN A1C
Est. average glucose Bld gHb Est-mCnc: 105 mg/dL
Hgb A1c MFr Bld: 5.3 % (ref 4.8–5.6)

## 2023-01-30 NOTE — Progress Notes (Signed)
ANNUAL EXAM Patient name: Suzanne Holt MRN [id number]  Date of birth: 08-03-1990 Chief Complaint:   Annual Exam  History of Present Illness:   Suzanne Holt is a 32 y.o. G0P0000  female being seen today for a routine annual exam.  Current complaints: Not interested in birth control, uses condoms for birth control. Family planning in two years. Denies vaginal or pelvic complaints. Reports mom has a history of Type 2DM, would like to be screened today. Reports hx irregular periods, but past two months been regular.    Patient's last menstrual period was 01/28/2023 (approximate).   Upstream - 01/30/23 0851       Pregnancy Intention Screening   Does the patient want to become pregnant in the next year? No    Does the patient's partner want to become pregnant in the next year? No   not next year   Would the patient like to discuss contraceptive options today? No      Contraception Wrap Up   Current Method Female Condom    Contraception Counseling Provided No            The pregnancy intention screening data noted above was reviewed. Potential methods of contraception were discussed. The patient elected to proceed with No data recorded.   Last pap 02/10/21. Results were:  NILM . H/O abnormal pap: no Last mammogram: n/a. Results were: N/A. Family h/o breast cancer: no      02/21/2022   10:41 AM 02/07/2022    9:08 AM 04/15/2021    9:17 AM 01/22/2021   10:15 AM 12/17/2020    3:25 PM  Depression screen PHQ 2/9  Decreased Interest 2 1  1 3   Down, Depressed, Hopeless 1 1  1 3   PHQ - 2 Score 3 2  2 6   Altered sleeping 3 2  3 3   Tired, decreased energy 3 3  2 3   Change in appetite 2 3  2 2   Feeling bad or failure about yourself  3 3  2 3   Trouble concentrating 3 3  2 2   Moving slowly or fidgety/restless 2 1  1 1   Suicidal thoughts 0 0  0 0  PHQ-9 Score 19 17  14 20   Difficult doing work/chores     Not difficult at all     Information is confidential and restricted. Go to  Review Flowsheets to unlock data.        02/21/2022   10:41 AM 02/07/2022    9:10 AM 06/30/2021   10:11 AM 01/22/2021   10:16 AM  GAD 7 : Generalized Anxiety Score  Nervous, Anxious, on Edge 2 3  2   Control/stop worrying 3 3  2   Worry too much - different things 3 3  3   Trouble relaxing 3 3  2   Restless 3 3  1   Easily annoyed or irritable 2 3  3   Afraid - awful might happen 2 3  1   Total GAD 7 Score 18 21  14   Anxiety Difficulty         Information is confidential and restricted. Go to Review Flowsheets to unlock data.     Review of Systems:   Pertinent items are noted in HPI Denies any headaches, blurred vision, fatigue, shortness of breath, chest pain, abdominal pain, abnormal vaginal discharge/itching/odor/irritation, problems with periods, bowel movements, urination, or intercourse unless otherwise stated above. Pertinent History Reviewed:  Reviewed past medical,surgical, social and family history.  Reviewed problem list, medications and allergies. Physical Assessment:  Vitals:   01/30/23 0846  BP: 113/78  Pulse: (!) 103  Weight: 127 lb (57.6 kg)  Body mass index is 21.8 kg/m.        Physical Examination:   General appearance - well appearing, and in no distress  Mental status - alert, oriented to person, place, and time  Psych:  She has a normal mood and affect  Skin - warm and dry, normal color, no suspicious lesions noted  Chest - effort normal, all lung fields clear to auscultation bilaterally  Heart - normal rate and regular rhythm  Neck:  midline trachea, no thyromegaly or nodules  Breasts - breasts appear normal, no suspicious masses, no skin or nipple changes or  axillary nodes  Abdomen - soft  Pelvic - deferred  Extremities:  No swelling or varicosities noted  Chaperone present for exam  No results found for this or any previous visit (from the past 24 hour(s)).  Assessment & Plan:  1. Well woman exam UTD on pap smear, encouraged self breast  exams  A1c today Discussed family planning, tracking cycles, OPK, prenatal vitamins when that time comes  2. Screen for STD (sexually transmitted disease) -CV swab   Labs/procedures today:   Mammogram: @ 30yo, or sooner if problems   Orders Placed This Encounter  Procedures   HgB A1c    Meds: No orders of the defined types were placed in this encounter.   Follow-up: Return in about 1 year (around 01/30/2024) for Thayer Jew, FNP

## 2023-01-30 NOTE — Progress Notes (Signed)
Patient scored positive on PHQ-9 and GAD-7. Patient denies any S.I. Patient states she sees [place]. Ask patient if she would like to schedule an appointment, patient declines at this time. Patient states she has Jamie's number if she needs anything.  Marcelino Duster, RN

## 2023-01-31 LAB — CERVICOVAGINAL ANCILLARY ONLY
Bacterial Vaginitis (gardnerella): POSITIVE — AB
Candida Glabrata: NEGATIVE
Candida Vaginitis: NEGATIVE
Chlamydia: NEGATIVE
Comment: NEGATIVE
Comment: NEGATIVE
Comment: NEGATIVE
Comment: NEGATIVE
Comment: NEGATIVE
Comment: NORMAL
Neisseria Gonorrhea: NEGATIVE
Trichomonas: NEGATIVE

## 2023-01-31 MED ORDER — METRONIDAZOLE 500 MG PO TABS: 500.0000 mg | ORAL_TABLET | Freq: Two times a day (BID) | ORAL | 0 refills | Status: AC

## 2023-01-31 NOTE — Addendum Note (Signed)
Addended by: Sue Lush on: 01/31/2023 03:33 PM   Modules accepted: Orders

## 2023-08-01 ENCOUNTER — Encounter: Payer: Self-pay | Admitting: Obstetrics and Gynecology

## 2023-09-13 ENCOUNTER — Other Ambulatory Visit: Payer: Self-pay

## 2023-09-13 ENCOUNTER — Ambulatory Visit: Payer: Self-pay | Admitting: Obstetrics and Gynecology

## 2023-09-13 ENCOUNTER — Encounter: Payer: Self-pay | Admitting: Obstetrics and Gynecology

## 2023-09-13 ENCOUNTER — Other Ambulatory Visit (HOSPITAL_COMMUNITY)
Admission: RE | Admit: 2023-09-13 | Discharge: 2023-09-13 | Disposition: A | Discharge status: Home / Self Care | Payer: Self-pay | Source: Ambulatory Visit | Attending: Obstetrics and Gynecology | Admitting: Obstetrics and Gynecology

## 2023-09-13 VITALS — BP 113/74 | HR 62 | Ht 64.0 in | Wt 124.7 lb

## 2023-09-13 DIAGNOSIS — Z113 Encounter for screening for infections with a predominantly sexual mode of transmission: Secondary | ICD-10-CM

## 2023-09-13 DIAGNOSIS — N926 Irregular menstruation, unspecified: Secondary | ICD-10-CM

## 2023-09-13 DIAGNOSIS — Z3202 Encounter for pregnancy test, result negative: Secondary | ICD-10-CM

## 2023-09-13 LAB — POCT PREGNANCY, URINE: Preg Test, Ur: NEGATIVE

## 2023-09-13 NOTE — Progress Notes (Signed)
 May 8-5/28   CC: irregular menses Subjective:    Patient ID: Suzanne Holt, female    DOB: 03-31-1990, 33 y.o.   MRN: [id number]  HPI 33 yo G0 seen to discuss irregular menses.Pt had a menses May 8-12 with spotting afterwards.  LMP 6/26-7/2 which was regular.  Pt briefly took OCPs in high school.  Pt is a current tobacco user with 1/4 ppd use.   Review of Systems     Objective:   Physical Exam Vitals:   09/13/23 0948 09/13/23 1034  BP: (!) 127/93 113/74  Pulse: 75 62         Assessment & Plan:   1. Screening for STD (sexually transmitted disease) (Primary) Per pt request - Cervicovaginal ancillary only( [place])  2. Irregular menses Menses overall are fairly normal.  Discussed expected management, ultrasound or potential trial of OCPs for cycle control.    Pt desires pelvic ultrasound and expectant management at this time. - Pregnancy, urine POC - US  PELVIC COMPLETE WITH TRANSVAGINAL; Future   Virtual visit in 6 weeks  Jerilynn DELENA Buddle, MD Faculty Attending, Center for [facility]

## 2023-09-14 LAB — CERVICOVAGINAL ANCILLARY ONLY
Bacterial Vaginitis (gardnerella): POSITIVE — AB
Candida Glabrata: NEGATIVE
Candida Vaginitis: POSITIVE — AB
Chlamydia: NEGATIVE
Comment: NEGATIVE
Comment: NEGATIVE
Comment: NEGATIVE
Comment: NEGATIVE
Comment: NEGATIVE
Comment: NORMAL
Neisseria Gonorrhea: NEGATIVE
Trichomonas: NEGATIVE

## 2023-09-15 ENCOUNTER — Encounter: Payer: Self-pay | Admitting: Obstetrics and Gynecology

## 2023-09-18 ENCOUNTER — Ambulatory Visit: Payer: Self-pay | Admitting: Obstetrics and Gynecology

## 2023-09-18 ENCOUNTER — Other Ambulatory Visit: Payer: Self-pay

## 2023-09-18 MED ORDER — FLUCONAZOLE 150 MG PO TABS
150.0000 mg | ORAL_TABLET | Freq: Once | ORAL | 0 refills | Status: AC
Start: 2023-09-18 — End: 2023-09-18

## 2023-09-18 MED ORDER — METRONIDAZOLE 500 MG PO TABS: 500.0000 mg | ORAL_TABLET | Freq: Two times a day (BID) | ORAL | 0 refills | Status: AC

## 2023-09-18 NOTE — Progress Notes (Signed)
Pt concern addressed via MyChart.   Shyla Gayheart, RN  

## 2023-09-18 NOTE — Telephone Encounter (Addendum)
-----   Message from Jerilynn DELENA Buddle sent at 09/18/2023  8:56 AM EDT ----- BV and yeast noted on swab, offer treatment ----- Message ----- From: Rebecka, Lab In [place] Sent: 09/13/2023  10:05 AM EDT To: Jerilynn DELENA Buddle, MD  Left message to return call for results.  Enora Trillo,RN

## 2023-09-29 ENCOUNTER — Other Ambulatory Visit: Payer: Self-pay

## 2023-09-29 ENCOUNTER — Emergency Department: Payer: Worker's Compensation

## 2023-09-29 ENCOUNTER — Emergency Department
Admission: EM | Admit: 2023-09-29 | Discharge: 2023-09-29 | Disposition: A | Discharge status: Home / Self Care | Payer: Worker's Compensation | Attending: Emergency Medicine | Admitting: Emergency Medicine

## 2023-09-29 DIAGNOSIS — M25571 Pain in right ankle and joints of right foot: Secondary | ICD-10-CM | POA: Insufficient documentation

## 2023-09-29 MED ORDER — OXYCODONE-ACETAMINOPHEN 5-325 MG PO TABS
1.0000 | ORAL_TABLET | Freq: Once | ORAL | Status: AC
  Administered 2023-09-29: 1 via ORAL
  Filled 2023-09-29: qty 1

## 2023-09-29 NOTE — ED Provider Notes (Addendum)
 Suzanne Holt Provider Note    Event Date/Time   First MD Initiated Contact with Patient 09/29/23 1835     (approximate)   History   Foot Injury   HPI  Suzanne Holt is a 33 y.o. female with history of GAD and bipolar disorder, presenting with right ankle pain.  States that she was at work, states that her ankle was wedged between a pallet and a power equipment, she did invert it.  She denies any weakness or numbness.  Has some bruising to the right lower lateral ankle.  She denies being on any blood thinners.     Physical Exam   Triage Vital Signs: ED Triage Vitals  Encounter Vitals Group     BP 09/29/23 1748 124/74     Girls Systolic BP Percentile --      Girls Diastolic BP Percentile --      Boys Systolic BP Percentile --      Boys Diastolic BP Percentile --      Pulse Rate 09/29/23 1748 72     Resp 09/29/23 1748 20     Temp 09/29/23 1748 98.1 F (36.7 C)     Temp Source 09/29/23 1748 Oral     SpO2 09/29/23 1748 100 %     Weight 09/29/23 1749 124 lb (56.2 kg)     Height 09/29/23 1749 5' 4 (1.626 m)     Head Circumference --      Peak Flow --      Pain Score 09/29/23 1748 8     Pain Loc --      Pain Education --      Exclude from Growth Chart --     Most recent vital signs: Vitals:   09/29/23 1748  BP: 124/74  Pulse: 72  Resp: 20  Temp: 98.1 F (36.7 C)  SpO2: 100%     General: Awake, no distress.  CV:  Good peripheral perfusion.  Resp:  Normal effort.  Abd:  No distention.  Other:  She has palpable DP pulses on the right, no tenderness to the hip, knee, femur, tib-fib, she does have some tenderness and ecchymoses and mild swelling to the right lateral ankle below the lateral malleoli, she is able to range her ankle.  No focal weakness or numbness.  No open wounds.  No tenderness to her navicular or to the medial ankle and foot.   ED Results / Procedures / Treatments   Labs (all labs ordered are listed, but only abnormal  results are displayed) Labs Reviewed - No data to display    RADIOLOGY On my independent interpretation, without obvious fracture   PROCEDURES:  Critical Care performed: No  Procedures   MEDICATIONS ORDERED IN ED: Medications  oxyCODONE -acetaminophen  (PERCOCET/ROXICET) 5-325 MG per tablet 1 tablet (has no administration in time range)     IMPRESSION / MDM / ASSESSMENT AND PLAN / ED COURSE  I reviewed the triage vital signs and the nursing notes.                              Differential diagnosis includes, but is not limited to, fracture, contusion, strain, sprain, ligamentous injury, doubt dislocation given that she does not have any obvious deformities on exam.  X-rays were obtained at triage.  Will give her Percocet here.  Patient's presentation is most consistent with acute presentation with potential threat to life or bodily function.  Independent interpretation  of imaging below.  Discussed with patient about her imaging results, will give her a walking boot as well as crutches, discussed with her about taking ibuprofen  and Tylenol  as needed for pain, also elevation and ice.  Will give her number to call for orthopedic surgery to follow-up outpatient.  Considered but no indication for inpatient admission at this time, she safe for outpatient management.  Will discharge with strict precautions.  Shared decision making done with patient and she is agreeable with this plan.    Clinical Course as of 09/29/23 1900  Fri Sep 29, 2023  1854 DG Ankle Complete Right IMPRESSION: Soft tissue swelling along the dorsum of the foot. No acute fracture or dislocation of the right foot and ankle.   [TT]  1854 DG Foot Complete Right IMPRESSION: Soft tissue swelling along the dorsum of the foot. No acute fracture or dislocation of the right foot and ankle.   [TT]    Clinical Course User Index [TT] [place], Lorelle Cummins, MD     FINAL CLINICAL IMPRESSION(S) / ED DIAGNOSES   Final  diagnoses:  Acute right ankle pain     Rx / DC Orders   ED Discharge Orders     None        Note:  This document was prepared using Dragon voice recognition software and may include unintentional dictation errors.    [place]mond Lorelle Cummins, MD 09/29/23 [place]    Waymond Lorelle Cummins, MD 09/29/23 [phone]

## 2023-09-29 NOTE — Discharge Instructions (Addendum)
 Please keep your legs elevated whenever you are sitting or standing, you can use ice.  You can weight-bear as tolerated, you can take forms or milligrams of ibuprofen  or 650 mg of Tylenol  every 6 hours as needed for pain.  I have also provided a number for you to call for orthopedic surgery to follow-up.

## 2023-09-29 NOTE — ED Triage Notes (Signed)
 Patient states she was at work when her right foot was crushed between a pallet and power equipment.

## 2023-10-05 ENCOUNTER — Ambulatory Visit (HOSPITAL_COMMUNITY)
Admission: RE | Admit: 2023-10-05 | Discharge: 2023-10-05 | Disposition: A | Discharge status: Home / Self Care | Payer: Self-pay | Source: Ambulatory Visit | Attending: Obstetrics and Gynecology | Admitting: Obstetrics and Gynecology

## 2023-10-05 DIAGNOSIS — N926 Irregular menstruation, unspecified: Secondary | ICD-10-CM | POA: Insufficient documentation

## 2023-11-01 ENCOUNTER — Telehealth: Payer: Self-pay | Admitting: Family Medicine

## 2023-11-01 DIAGNOSIS — N926 Irregular menstruation, unspecified: Secondary | ICD-10-CM

## 2023-11-02 DIAGNOSIS — N926 Irregular menstruation, unspecified: Secondary | ICD-10-CM | POA: Insufficient documentation

## 2023-11-02 NOTE — Assessment & Plan Note (Signed)
 Patient reports continued issues with irregular menses.  They are not too heavy and she is interested in getting pregnant in the not-too-distant future send not interested in any OCPs at this time.

## 2023-11-02 NOTE — Progress Notes (Unsigned)
 GYNECOLOGY VIRTUAL VISIT ENCOUNTER NOTE  Provider location: [facility] for [facility]   Patient location: Home  I connected with Suzanne Holt on 11/03/23 at  3:35 PM EDT by MyChart Video Encounter and verified that I am speaking with the correct person using two identifiers.   I discussed the limitations, risks, security and privacy concerns of performing an evaluation and management service virtually and the availability of in person appointments. I also discussed with the patient that there may be a patient responsible charge related to this service. The patient expressed understanding and agreed to proceed.   History:  Suzanne Holt is a 33 y.o. G0P0000 female being evaluated today for review of results from ultrasound. She denies any abnormal vaginal discharge, pelvic pain or other concerns.  Reports continued abnormal menses where they are just irregular and sometimes heavy.     Past Medical History:  Diagnosis Date   Anemia    Bartholin gland cyst    recurrent   Bipolar 1 disorder (HCC)    GAD (generalized anxiety disorder)    GERD (gastroesophageal reflux disease)    History of trichomonal vaginitis    Past Surgical History:  Procedure Laterality Date   BARTHOLIN CYST MARSUPIALIZATION N/A 07/17/2013   Procedure: BARTHOLIN CYST MARSUPIALIZATION;  Surgeon: Gigi Botts, MD;  Location: [facility];  Service: Gynecology;  Laterality: N/A;   BARTHOLIN CYST MARSUPIALIZATION Left 12/22/2021   Procedure: BARTHOLIN CYST MARSUPIALIZATION;  Surgeon: Herchel Gloris LABOR, MD;  Location: [facility];  Service: Gynecology;  Laterality: Left;   DILATION AND CURETTAGE OF UTERUS  07/17/2013   Procedure: Bartholin Cyst culture;  Surgeon: Gigi Botts, MD;  Location: [facility];  Service: Gynecology;;   FLEXOR TENDON REPAIR Left 03/29/2014   Procedure: LEFT SMALL FINGER FLEXOR DIGITORIUM PROSUNDUS/FLEXOR DIGITORIUM SUPERFICIALIS AS NECESSARY TENDON FLEXOR REPAIR;   Surgeon: Elsie Mussel, MD;  Location: [facility];  Service: Orthopedics;  Laterality: Left;   WISDOM TOOTH EXTRACTION     The following portions of the patient's history were reviewed and updated as appropriate: allergies, current medications, past family history, past medical history, past social history, past surgical history and problem list.   Health Maintenance:  Normal pap and negative HRHPV on 02/11/2024.    Review of Systems:  Pertinent items noted in HPI and remainder of comprehensive ROS otherwise negative.  Physical Exam:   General:  Alert, oriented and cooperative. Patient appears to be in no acute distress.  Mental Status: Normal mood and affect. Normal behavior. Normal judgment and thought content.   Respiratory: Normal respiratory effort, no problems with respiration noted  Rest of physical exam deferred due to type of encounter  Labs and Imaging No results found for this or any previous visit (from the past 2 weeks). US  PELVIC COMPLETE WITH TRANSVAGINAL Result Date: 10/12/2023 CLINICAL DATA:  irregular menses EXAM: ULTRASOUND OF PELVIS TECHNIQUE: Transabdominal and transvaginalultrasound examination of the pelvis was performed including evaluation of the uterus, ovaries, adnexal regions, and pelvic cul-de-sac. COMPARISON:  None Available. FINDINGS: Uterusanteverted, 7 x 5 x 4 cm. The endometrium thickened, 1.9 cm without evidence of focal lesions. The uterine cavity is empty. There are no uterine masses. Right ovary 3.8 x 3.2 x 2.5 cm. Left ovary 3.8 x 3.3 x 2.4 cm. Ovaries demonstrate numerous small peripheral follicles, typical for polycystic ovary syndrome. Images of the adnexae demonstrated no masses or fluid collections. Color Doppler demonstrated ovarian blood flow. IMPRESSION: 1. Thickened endometrium without evidence of focal  lesions. 2. Polycystic ovary syndrome. Electronically Signed   By: Fonda Field M.D.   On: 10/12/2023 18:01       Assessment and Plan:      Irregular menses Patient reports continued issues with irregular menses.  They are not too heavy and she is interested in getting pregnant in the not-too-distant future send not interested in any OCPs at this time.  Offered OCPs as well as IUD but patient declined.  Also sent patient MyChart message offering EMB.  If she accepts we will get this scheduled.        I discussed the assessment and treatment plan with the patient. The patient was provided an opportunity to ask questions and all were answered. The patient agreed with the plan and demonstrated an understanding of the instructions.   The patient was advised to call back or seek an in-person evaluation/go to the ED if the symptoms worsen or if the condition fails to improve as anticipated.  I provided 8 minutes of face-to-face time during this encounter. I also spent 15 minutes dedicated to the care of this patient including pre-visit review of records, post visit ordering of medications and appropriate tests or procedures, coordinating care and documenting this visit encounter.    Steven Basso V Jamill Wetmore, MD [facility] [employer], [facility]

## 2023-11-03 ENCOUNTER — Encounter: Payer: Self-pay | Admitting: Family Medicine

## 2024-01-06 ENCOUNTER — Telehealth: Payer: Self-pay | Admitting: Physician Assistant

## 2024-01-06 DIAGNOSIS — K625 Hemorrhage of anus and rectum: Secondary | ICD-10-CM

## 2024-01-06 DIAGNOSIS — K649 Unspecified hemorrhoids: Secondary | ICD-10-CM

## 2024-01-06 MED ORDER — HYDROCORTISONE ACETATE 25 MG RE SUPP: 25.0000 mg | Freq: Two times a day (BID) | RECTAL | 0 refills | Status: AC

## 2024-01-06 NOTE — Patient Instructions (Signed)
 SABRA

## 2024-01-06 NOTE — Progress Notes (Signed)
 Patient with video difficulties. Stated she would complete chat instead of video.

## 2024-01-06 NOTE — Progress Notes (Signed)

## 2024-02-01 ENCOUNTER — Ambulatory Visit: Payer: Self-pay | Admitting: Obstetrics and Gynecology

## 2024-02-01 ENCOUNTER — Other Ambulatory Visit: Payer: Self-pay

## 2024-04-01 ENCOUNTER — Ambulatory Visit: Payer: Self-pay | Admitting: Obstetrics and Gynecology
